# Patient Record
Sex: Female | Born: 1995 | Race: White | Hispanic: No | Smoking: Never smoker
Health system: Southern US, Community
[De-identification: ages and names within clinical notes are randomized; demographics above are authoritative.]

## PROBLEM LIST (undated history)

## (undated) ENCOUNTER — Inpatient Hospital Stay (HOSPITAL_COMMUNITY): Payer: Self-pay

## (undated) ENCOUNTER — Inpatient Hospital Stay: Payer: Self-pay

## (undated) ENCOUNTER — Inpatient Hospital Stay (HOSPITAL_COMMUNITY): Payer: Medicaid Other

## (undated) DIAGNOSIS — Z8742 Personal history of other diseases of the female genital tract: Secondary | ICD-10-CM

## (undated) DIAGNOSIS — N63 Unspecified lump in unspecified breast: Secondary | ICD-10-CM

## (undated) DIAGNOSIS — O1410 Severe pre-eclampsia, unspecified trimester: Secondary | ICD-10-CM

## (undated) DIAGNOSIS — Z8619 Personal history of other infectious and parasitic diseases: Secondary | ICD-10-CM

## (undated) DIAGNOSIS — D649 Anemia, unspecified: Secondary | ICD-10-CM

## (undated) DIAGNOSIS — B379 Candidiasis, unspecified: Secondary | ICD-10-CM

## (undated) HISTORY — DX: Personal history of other diseases of the female genital tract: Z87.42

## (undated) HISTORY — PX: AUGMENTATION MAMMAPLASTY: SUR837

## (undated) HISTORY — DX: Personal history of other infectious and parasitic diseases: Z86.19

## (undated) HISTORY — PX: NO PAST SURGERIES: SHX2092

## (undated) HISTORY — DX: Candidiasis, unspecified: B37.9

---
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
## 2014-01-28 ENCOUNTER — Encounter (HOSPITAL_COMMUNITY): Payer: Self-pay | Admitting: *Deleted

## 2015-03-30 NOTE — L&D Delivery Note (Signed)
Delivery Note At 2:37 AM a viable femalewas delivered via Vaginal, Spontaneous Delivery left hand presented just behind head splinted and delivered followed by remainder of body (Presentation: straight OA.  APGAR: 8, 9; weight 7lbs 8oz .   Placenta status:spontaneous, intact.  Cord: 3VC  withoutcomplications.  Cord pH: N/A  Anesthesia:  Epidural Episiotomy: None Lacerations:  none Suture Repair: N/A Est. Blood Loss (mL):  300  Mom to postpartum.  Baby to Couplet care / Skin to Skin.  Clifton Kovacic M 03/20/2016, 2:49 AM

## 2015-07-14 ENCOUNTER — Emergency Department
Admission: EM | Admit: 2015-07-14 | Discharge: 2015-07-14 | Disposition: A | Discharge status: Home / Self Care | Payer: Medicaid Other | Attending: Emergency Medicine | Admitting: Emergency Medicine

## 2015-07-14 ENCOUNTER — Emergency Department (HOSPITAL_COMMUNITY)
Admission: EM | Admit: 2015-07-14 | Discharge: 2015-07-14 | Disposition: A | Discharge status: Home / Self Care | Payer: Medicaid Other | Attending: Emergency Medicine | Admitting: Emergency Medicine

## 2015-07-14 ENCOUNTER — Encounter: Payer: Self-pay | Admitting: Emergency Medicine

## 2015-07-14 DIAGNOSIS — N6011 Diffuse cystic mastopathy of right breast: Secondary | ICD-10-CM

## 2015-07-14 DIAGNOSIS — R52 Pain, unspecified: Secondary | ICD-10-CM

## 2015-07-14 DIAGNOSIS — N644 Mastodynia: Secondary | ICD-10-CM | POA: Diagnosis present

## 2015-07-14 DIAGNOSIS — N6012 Diffuse cystic mastopathy of left breast: Secondary | ICD-10-CM | POA: Diagnosis not present

## 2015-07-14 DIAGNOSIS — Z8742 Personal history of other diseases of the female genital tract: Secondary | ICD-10-CM | POA: Diagnosis not present

## 2015-07-14 MED ORDER — TRAMADOL HCL 50 MG PO TABS
50.0000 mg | ORAL_TABLET | Freq: Once | ORAL | Status: DC
  Filled 2015-07-14: qty 1

## 2015-07-14 MED ORDER — TRAMADOL HCL 50 MG PO TABS: 50.0000 mg | ORAL_TABLET | Freq: Three times a day (TID) | ORAL | Status: DC | PRN

## 2015-07-14 NOTE — ED Notes (Signed)
Discharge instructions reviewed with patient. Patient verbalized understanding. Patient ambulated to lobby without difficulty.   

## 2015-07-14 NOTE — ED Notes (Signed)
Pt reporting that she first noticed lump in right breast approximately one month ago. Since then area has become painful and larger. Pt has painful, mobile lump in right breast above nipple towards armpit. Pt reports that the pain has gotten so bad over the last week that it has become hard to sleep at night. Pt reports burning pain. Pt denies any nipple discharge. No redness or swelling noted to breast. Pt reports that her last period was much lighter than her periods normally are. She took a pregnancy test at home that was negative. Reports that over the last week she has not been feeling well, with having hot flashes and chills, and then some vomiting yesterday. Denies any urinary symptoms.

## 2015-07-14 NOTE — ED Provider Notes (Signed)
[facility] Emergency Department Provider Note ____________________________________________  Time seen: 2015  I have reviewed the triage vital signs and the nursing notes.  HISTORY  Chief Complaint  Breast Pain  HPI [name] [name] is a 20 y.o. female resents to the ED for evaluation of about a month complaint of breast lump and tenderness on the right side. She denies any local injury, trauma or contusion. She denies any skin changes, warmth, redness, or nipple discharge. She describes a tender, lump, with occasional burning sensations through the breast. She is not breast feeding. She rates her pain at 5/10.   Past Medical History  Diagnosis Date  . No pertinent past medical history   . Yeast infection   . Hx of ovarian cyst   . H/O rubella   . H/O varicella     Patient Active Problem List   Diagnosis Date Noted  . Pre-eclampsia 11/28/2011  . Vaginal delivery 11/27/2011  . Perineal laceration during delivery 11/27/2011  . Post-term pregnancy, 40-42 weeks of gestation 11/26/2011  . Abnormal quad screen 07/27/2011  . Late prenatal care 06/10/2011  . Teen pregnancy 06/10/2011    Past Surgical History  Procedure Laterality Date  . No past surgeries      Current Outpatient Rx  Name  Route  Sig  Dispense  Refill  . cetirizine (ZYRTEC) 10 MG tablet   Oral   Take 10 mg by mouth daily.         Marland Kitchen etonogestrel (NEXPLANON) 68 MG IMPL implant   Subcutaneous   Inject 1 each into the skin once.         . Multiple Vitamin (MULTIVITAMIN WITH MINERALS) TABS   Oral   Take 1 tablet by mouth daily.         . traMADol (ULTRAM) 50 MG tablet   Oral   Take 1 tablet (50 mg total) by mouth 3 (three) times daily as needed.   20 tablet   0    Allergies Review of patient's allergies indicates no known allergies.  Family History  Problem Relation Age of Onset  . Alcohol abuse Mother   . Depression Mother   . Alcohol abuse Father   . Diabetes Maternal  Grandmother   . Mental retardation Cousin   . Autism Cousin     Social History Social History  Substance Use Topics  . Smoking status: Never Smoker   . Smokeless tobacco: Never Used  . Alcohol Use: No   Review of Systems  Constitutional: Negative for fever. Eyes: Negative for visual changes. ENT: Negative for sore throat. Cardiovascular: Negative for chest pain.  Respiratory: Negative for shortness of breath. Gastrointestinal: Negative for abdominal pain, vomiting and diarrhea. Genitourinary: Negative for dysuria. Musculoskeletal: Negative for back pain. Skin: Negative for rash. Right breast pain as above Neurological: Negative for headaches, focal weakness or numbness. ____________________________________________  PHYSICAL EXAM:  VITAL SIGNS: ED Triage Vitals  Enc Vitals Group     BP 07/14/15 1903 190/55 mmHg     Pulse Rate 07/14/15 1903 87     Resp --      Temp 07/14/15 1903 98.4 F (36.9 C)     Temp Source 07/14/15 1903 Oral     SpO2 07/14/15 1903 100 %     Weight 07/14/15 1903 103 lb (46.72 kg)     Height 07/14/15 1903  (1.575 m)     Head Cir --      Peak Flow --  Pain Score 07/14/15 1903 5     Pain Loc --      Pain Edu? --      Excl. in GC? --    Constitutional: Alert and oriented. Well appearing and in no distress. Head: Normocephalic and atraumatic. Hematological/Lymphatic/Immunological: No cervical or supra-sub-/clavicular lymphadenopathy. Cardiovascular: Normal rate, regular rhythm.  Breast: Right breast with normal appearance. No skin dimpling, erythema, induration, or peau 'd orange appearance. No nipple retraction or discharge. Tender, palpable, glandular cyst to the right breast at the upper outer quadrant. Cystic lesion is freely mobile, soft, and compressible. Similar nontender fibrocystic changes noted on the left breast. Respiratory: Normal respiratory effort. No wheezes/rales/rhonchi. Musculoskeletal: Nontender with normal range of  motion in all extremities.  Neurologic:  Normal gait without ataxia. Normal speech and language. No gross focal neurologic deficits are appreciated. Skin:  Skin is warm, dry and intact. No rash noted. Psychiatric: Mood and affect are normal. Patient exhibits appropriate insight and judgment. ____________________________________________  PROCEDURES  Ultram 50 mg PO ____________________________________________  INITIAL IMPRESSION / ASSESSMENT AND PLAN / ED COURSE  Patient with likely acute breast tenderness secondary to fibrocystic changes to the breast. She has multiple palpable glandular lesions to the bilateral breasts, most palpable to the right upper quadrant with localized tenderness. No clinical suspicion for acute breast abscess, mastitis, or malignant lesion. Patient is [facility] for further evaluation and ultrasound. She is discharged with a prescription for Ultram dose as needed for intermittent pain. She is also advised to follow with primary care provider as necessary. ____________________________________________  FINAL CLINICAL IMPRESSION(S) / ED DIAGNOSES  Final diagnoses:  Fibrocystic breast changes of both breasts  Acute breast pain      Lissa HoardJenise V Bacon Chalee Hirota, PA-C 07/14/15 [id number]  Minna AntisKevin Paduchowski, MD 07/14/15 [id number]

## 2015-07-14 NOTE — ED Notes (Signed)
Pt called  No no answer

## 2015-07-14 NOTE — Discharge Instructions (Signed)
Breast Self-Awareness Practicing breast self-awareness may pick up problems early, prevent significant medical complications, and possibly save your life. By practicing breast self-awareness, you can become familiar with how your breasts look and feel and if your breasts are changing. This allows you to notice changes early. It can also offer you some reassurance that your breast health is good. One way to learn what is normal for your breasts and whether your breasts are changing is to do a breast self-exam. If you find a lump or something that was not present in the past, it is best to contact your caregiver right away. Other findings that should be evaluated by your caregiver include nipple discharge, especially if it is bloody; skin changes or reddening; areas where the skin seems to be pulled in (retracted); or new lumps and bumps. Breast pain is seldom associated with cancer (malignancy), but should also be evaluated by a caregiver. HOW TO PERFORM A BREAST SELF-EXAM The best time to examine your breasts is 5-7 days after your menstrual period is over. During menstruation, the breasts are lumpier, and it may be more difficult to pick up changes. If you do not menstruate, have reached menopause, or had your uterus removed (hysterectomy), you should examine your breasts at regular intervals, such as monthly. If you are breastfeeding, examine your breasts after a feeding or after using a breast pump. Breast implants do not decrease the risk for lumps or tumors, so continue to perform breast self-exams as recommended. Talk to your caregiver about how to determine the difference between the implant and breast tissue. Also, talk about the amount of pressure you should use during the exam. Over time, you will become more familiar with the variations of your breasts and more comfortable with the exam. A breast self-exam requires you to remove all your clothes above the waist.  Look at your breasts and nipples.  Stand in front of a mirror in a room with good lighting. With your hands on your hips, push your hands firmly downward. Look for a difference in shape, contour, and size from one breast to the other (asymmetry). Asymmetry includes puckers, dips, or bumps. Also, look for skin changes, such as reddened or scaly areas on the breasts. Look for nipple changes, such as discharge, dimpling, repositioning, or redness.  Carefully feel your breasts. This is best done either in the shower or tub while using soapy water or when flat on your back. Place the arm (on the side of the breast you are examining) above your head. Use the pads (not the fingertips) of your three middle fingers on your opposite hand to feel your breasts. Start in the underarm area and use  inch (2 cm) overlapping circles to feel your breast. Use 3 different levels of pressure (light, medium, and firm pressure) at each circle before moving to the next circle. The light pressure is needed to feel the tissue closest to the skin. The medium pressure will help to feel breast tissue a little deeper, while the firm pressure is needed to feel the tissue close to the ribs. Continue the overlapping circles, moving downward over the breast until you feel your ribs below your breast. Then, move one finger-width towards the center of the body. Continue to use the  inch (2 cm) overlapping circles to feel your breast as you move slowly up toward the collar bone (clavicle) near the base of the neck. Continue the up and down exam using all 3 pressures until you reach the  middle of the chest. Do this with each breast, carefully feeling for lumps or changes.   Keep a written record with breast changes or normal findings for each breast. By writing this information down, you do not need to depend only on memory for size, tenderness, or location. Write down where you are in your menstrual cycle, if you are still menstruating. Breast tissue can have some lumps or thick  tissue. However, see your caregiver if you find anything that concerns you.  SEEK MEDICAL CARE IF:  You see a change in shape, contour, or size of your breasts or nipples.   You see skin changes, such as reddened or scaly areas on the breasts or nipples.   You have an unusual discharge from your nipples.   You feel a new lump or unusually thick areas.    This information is not intended to replace advice given to you by your health care provider. Make sure you discuss any questions you have with your health care provider.   Document Released: 03/15/2005 Document Revised: 03/01/2012 Document Reviewed: 06/30/2011 Elsevier Interactive Patient Education 2016 Elsevier Inc[employer]  Fibrocystic Breast Changes Fibrocystic breast changes occur when breast ducts become blocked, causing painful, fluid-filled lumps (cysts) to form in the breast. This is a common condition that is noncancerous (benign). It occurs when women go through hormonal changes during their menstrual cycle. Fibrocystic breast changes can affect one or both breasts. CAUSES  The exact cause of fibrocystic breast changes is not known, but it may be related to the female hormones estrogen and progesterone. Family traits that get passed from parent to child (genetics) may also be a factor in some cases. SIGNS AND SYMPTOMS   Tenderness, mild discomfort, or pain.   Swelling.   Rope-like feeling when touching the breast.   Lumpy breast, one or both sides.   Changes in breast size, especially before (larger) and after (smaller) the menstrual period.   Green or dark brown nipple discharge (not blood).  Symptoms are usually worse before menstrual periods start and get better toward the end of the menstrual period.  DIAGNOSIS  To make a diagnosis, your health care provider will ask you questions and perform a physical exam of your breasts. The health care provider may recommend other tests that can examine inside your breasts,  such as:  A breast X-ray (mammogram).   Ultrasonography.  An MRI.  If something more than fibrocystic breast changes is suspected, your health care provider may take a breast tissue sample (breast biopsy) to examine. TREATMENT  Often, treatment is not needed. Your health care provider may recommend over-the-counter pain relievers to help lessen pain or discomfort caused by the fibrocystic breast changes. You may also be asked to change your diet to limit or stop eating foods or drinking beverages that contain caffeine. Foods and beverages that contain caffeine include chocolate, soda, coffee, and tea. Reducing sugar and fat in your diet may also help. Your health care provider may also recommend:  Fine needle aspiration to remove fluid from a cyst that is causing pain.   Surgery to remove a large, persistent, and tender cyst. HOME CARE INSTRUCTIONS   Examine your breasts after every menstrual period. If you do not have menstrual periods, check your breasts the first day of every month. Feel for changes, such as more tenderness, a new growth, a change in breast size, or a change in a lump that has always been there.   Only take over-the-counter or prescription medicine  as directed by your health care provider.   Wear a well-fitted support or sports bra, especially when exercising.   Decrease or avoid caffeine, fat, and sugar in your diet as directed by your health care provider.  SEEK MEDICAL CARE IF:   You have fluid leaking (discharge) from your nipples, especially bloody discharge.   You have new lumps or bumps in the breast.   Your breast or breasts become enlarged, red, and painful.   You have areas of your breast that pucker in.   Your nipples appear flat or indented.    This information is not intended to replace advice given to you by your health care provider. Make sure you discuss any questions you have with your health care provider.   Document Released:  12/30/2005 Document Revised: 12/04/2014 Document Reviewed: 09/03/2012 Elsevier Interactive Patient Education 2016 [employer].   Your exam is consistent with a tender breast cyst with underlying fibrocystic breast changes. You have "lumpy" breast. This common, benign conditions is due to the glands within the breast. You should continue to take Tylenol or Motrin for pain. Apply warm compresses for pain relief. Follow-up with the [facility] for further evaluation.

## 2015-07-14 NOTE — ED Notes (Signed)
Unable to located PT in front lobby . Pt called x3.

## 2015-07-14 NOTE — ED Notes (Signed)
Pt presents to ED with c/o painful lump to right breast. Pt states she noticed a small knot about a month ago; area has become larger and more painful since onset. The past week pt has been unable to sleep due to her discomfort. Pt states there is no redness and you can not see the area; only feel it when you press on it. Denies nipple discharge.

## 2015-07-29 ENCOUNTER — Emergency Department
Admission: EM | Admit: 2015-07-29 | Discharge: 2015-07-29 | Disposition: A | Discharge status: Home / Self Care | Payer: Medicaid Other

## 2015-07-29 NOTE — ED Notes (Signed)
Called for pt in waiting room x 3, no answer.  

## 2015-07-29 NOTE — ED Notes (Addendum)
Called in waiting room x 2 no answer

## 2015-07-29 NOTE — ED Notes (Signed)
Called pt in waiting room x 3, no answer.

## 2015-07-30 ENCOUNTER — Ambulatory Visit: Payer: Medicaid Other | Attending: Oncology

## 2015-08-20 ENCOUNTER — Encounter: Payer: Self-pay | Admitting: Emergency Medicine

## 2015-08-20 DIAGNOSIS — Z3A08 8 weeks gestation of pregnancy: Secondary | ICD-10-CM | POA: Diagnosis not present

## 2015-08-20 DIAGNOSIS — O210 Mild hyperemesis gravidarum: Secondary | ICD-10-CM | POA: Diagnosis not present

## 2015-08-20 LAB — COMPREHENSIVE METABOLIC PANEL
ALK PHOS: 33 U/L — AB (ref 38–126)
ALT: 13 U/L — AB (ref 14–54)
AST: 15 U/L (ref 15–41)
Albumin: 4.2 g/dL (ref 3.5–5.0)
Anion gap: 6 (ref 5–15)
BUN: 17 mg/dL (ref 6–20)
CALCIUM: 9.5 mg/dL (ref 8.9–10.3)
CO2: 25 mmol/L (ref 22–32)
CREATININE: 0.62 mg/dL (ref 0.44–1.00)
Chloride: 105 mmol/L (ref 101–111)
Glucose, Bld: 87 mg/dL (ref 65–99)
Potassium: 3.7 mmol/L (ref 3.5–5.1)
Sodium: 136 mmol/L (ref 135–145)
Total Bilirubin: 0.3 mg/dL (ref 0.3–1.2)
Total Protein: 7.6 g/dL (ref 6.5–8.1)

## 2015-08-20 LAB — URINALYSIS COMPLETE WITH MICROSCOPIC (ARMC ONLY)
BACTERIA UA: NONE SEEN
Bilirubin Urine: NEGATIVE
GLUCOSE, UA: NEGATIVE mg/dL
Hgb urine dipstick: NEGATIVE
Ketones, ur: NEGATIVE mg/dL
NITRITE: NEGATIVE
Protein, ur: NEGATIVE mg/dL
SPECIFIC GRAVITY, URINE: 1.03 (ref 1.005–1.030)
pH: 6 (ref 5.0–8.0)

## 2015-08-20 LAB — CBC
HCT: 35.3 % (ref 35.0–47.0)
Hemoglobin: 12.2 g/dL (ref 12.0–16.0)
MCH: 29.6 pg (ref 26.0–34.0)
MCHC: 34.5 g/dL (ref 32.0–36.0)
MCV: 85.8 fL (ref 80.0–100.0)
Platelets: 277 10*3/uL (ref 150–440)
RBC: 4.11 MIL/uL (ref 3.80–5.20)
RDW: 12.1 % (ref 11.5–14.5)
WBC: 13.4 10*3/uL — ABNORMAL HIGH (ref 3.6–11.0)

## 2015-08-20 NOTE — ED Notes (Addendum)
Patient ambulatory to triage with steady gait, without difficulty or distress noted; pt reports N/V and abd cramping; [redacted]wks pregnant; G2; [facility] st was taking zofran that was rx in past and work but ran out

## 2015-08-21 ENCOUNTER — Emergency Department
Admission: EM | Admit: 2015-08-21 | Discharge: 2015-08-21 | Disposition: A | Discharge status: Home / Self Care | Payer: Medicaid Other | Attending: Emergency Medicine | Admitting: Emergency Medicine

## 2015-08-21 DIAGNOSIS — O210 Mild hyperemesis gravidarum: Secondary | ICD-10-CM

## 2015-08-21 LAB — HCG, QUANTITATIVE, PREGNANCY: hCG, Beta Chain, Quant, S: 193266 m[IU]/mL — ABNORMAL HIGH (ref <5)

## 2015-08-21 MED ORDER — METOCLOPRAMIDE HCL 5 MG/ML IJ SOLN
10.0000 mg | Freq: Once | INTRAMUSCULAR | Status: AC
  Administered 2015-08-21: 10 mg via INTRAVENOUS
  Filled 2015-08-21: qty 2

## 2015-08-21 MED ORDER — SODIUM CHLORIDE 0.9 % IV BOLUS (SEPSIS)
1000.0000 mL | Freq: Once | INTRAVENOUS | Status: AC
  Administered 2015-08-21: 1000 mL via INTRAVENOUS

## 2015-08-21 MED ORDER — METOCLOPRAMIDE HCL 10 MG PO TABS: 10.0000 mg | ORAL_TABLET | Freq: Three times a day (TID) | ORAL | Status: DC

## 2015-08-21 NOTE — Discharge Instructions (Signed)
Hyperemesis Gravidarum  Hyperemesis gravidarum is a severe form of nausea and vomiting that happens during pregnancy. Hyperemesis is worse than morning sickness. It may cause you to have nausea or vomiting all day for many days. It may keep you from eating and drinking enough food and liquids. Hyperemesis usually occurs during the first half (the first 20 weeks) of pregnancy. It often goes away once a woman is in her second half of pregnancy. However, sometimes hyperemesis continues through an entire pregnancy.   CAUSES   The cause of this condition is not completely known but is thought to be related to changes in the body's hormones when pregnant. It could be from the high level of the pregnancy hormone or an increase in estrogen in the body.   SIGNS AND SYMPTOMS    Severe nausea and vomiting.   Nausea that does not go away.   Vomiting that does not allow you to keep any food down.   Weight loss and body fluid loss (dehydration).   Having no desire to eat or not liking food you have previously enjoyed.  DIAGNOSIS   Your health care provider will do a physical exam and ask you about your symptoms. He or she may also order blood tests and urine tests to make sure something else is not causing the problem.   TREATMENT   You may only need medicine to control the problem. If medicines do not control the nausea and vomiting, you will be treated in the hospital to prevent dehydration, increased acid in the blood (acidosis), weight loss, and changes in the electrolytes in your body that may harm the unborn baby (fetus). You may need IV fluids.   HOME CARE INSTRUCTIONS    Only take over-the-counter or prescription medicines as directed by your health care provider.   Try eating a couple of dry crackers or toast in the morning before getting out of bed.   Avoid foods and smells that upset your stomach.   Avoid fatty and spicy foods.   Eat 5-6 small meals a day.   Do not drink when eating meals. Drink between  meals.   For snacks, eat high-protein foods, such as cheese.   Eat or suck on things that have ginger in them. Ginger helps nausea.   Avoid food preparation. The smell of food can spoil your appetite.   Avoid iron pills and iron in your multivitamins until after 3-4 months of being pregnant. However, consult with your health care provider before stopping any prescribed iron pills.  SEEK MEDICAL CARE IF:    Your abdominal pain increases.   You have a severe headache.   You have vision problems.   You are losing weight.  SEEK IMMEDIATE MEDICAL CARE IF:    You are unable to keep fluids down.   You vomit blood.   You have constant nausea and vomiting.   You have excessive weakness.   You have extreme thirst.   You have dizziness or fainting.   You have a fever or persistent symptoms for more than 2-3 days.   You have a fever and your symptoms suddenly get worse.  MAKE SURE YOU:    Understand these instructions.   Will watch your condition.   Will get help right away if you are not doing well or get worse.     This information is not intended to replace advice given to you by your health care provider. Make sure you discuss any questions you have with   your health care provider.     Document Released: 03/15/2005 Document Revised: 01/03/2013 Document Reviewed: 10/25/2012  Elsevier Interactive Patient Education 2016 Elsevier Inc.

## 2015-08-21 NOTE — ED Provider Notes (Signed)
[facility] Emergency Department Provider Note  ____________________________________________  Time seen: 1:30 AM  I have reviewed the triage vital signs and the nursing notes.   HISTORY  Chief Complaint Emesis During Pregnancy      HPI [name] [name] is a 20 y.o. female gravida 2 para 1 approximately [redacted] weeks pregnant presents with nausea and vomiting with 5 episodes of nonbloody emesis today. Patient admits to decreased by mouth intake secondary to persistent nausea. Patient denies any abdominal pain or cramping at this time. Patient denies any fever no dysuria.     Past Medical History  Diagnosis Date  . No pertinent past medical history   . Yeast infection   . Hx of ovarian cyst   . H/O rubella   . H/O varicella     Patient Active Problem List   Diagnosis Date Noted  . Pre-eclampsia 11/28/2011  . Vaginal delivery 11/27/2011  . Perineal laceration during delivery 11/27/2011  . Post-term pregnancy, 40-42 weeks of gestation 11/26/2011  . Abnormal quad screen 07/27/2011  . Late prenatal care 06/10/2011  . Teen pregnancy 06/10/2011    Past Surgical History  Procedure Laterality Date  . No past surgeries      Current Outpatient Rx  Name  Route  Sig  Dispense  Refill  . cetirizine (ZYRTEC) 10 MG tablet   Oral   Take 10 mg by mouth daily.         Marland Kitchen etonogestrel (NEXPLANON) 68 MG IMPL implant   Subcutaneous   Inject 1 each into the skin once.         . metoCLOPramide (REGLAN) 10 MG tablet   Oral   Take 1 tablet (10 mg total) by mouth 3 (three) times daily with meals.   60 tablet   0   . Multiple Vitamin (MULTIVITAMIN WITH MINERALS) TABS   Oral   Take 1 tablet by mouth daily.         . traMADol (ULTRAM) 50 MG tablet   Oral   Take 1 tablet (50 mg total) by mouth 3 (three) times daily as needed.   20 tablet   0     Allergies Penicillins  Family History  Problem Relation Age of Onset  . Alcohol abuse Mother   .  Depression Mother   . Alcohol abuse Father   . Diabetes Maternal Grandmother   . Mental retardation Cousin   . Autism Cousin     Social History Social History  Substance Use Topics  . Smoking status: Never Smoker   . Smokeless tobacco: Never Used  . Alcohol Use: No    Review of Systems  Constitutional: Negative for fever. Eyes: Negative for visual changes. ENT: Negative for sore throat. Cardiovascular: Negative for chest pain. Respiratory: Negative for shortness of breath. Gastrointestinal: Positive for vomiting Genitourinary: Negative for dysuria. Musculoskeletal: Negative for back pain. Skin: Negative for rash. Neurological: Negative for headaches, focal weakness or numbness.  10-point ROS otherwise negative.  ____________________________________________   PHYSICAL EXAM:  VITAL SIGNS: ED Triage Vitals  Enc Vitals Group     BP 08/20/15 2303 111/68 mmHg     Pulse Rate 08/20/15 2303 100     Resp 08/20/15 2303 18     Temp 08/20/15 2303 98.2 F (36.8 C)     Temp Source 08/20/15 2303 Oral     SpO2 08/20/15 2303 95 %     Weight 08/20/15 2303 103 lb (46.72 kg)     Height 08/20/15 2303  (  1.575 m)     Head Cir --      Peak Flow --      Pain Score 08/20/15 2303 7     Pain Loc --      Pain Edu? --      Excl. in GC? --     Constitutional: Alert and oriented. Well appearing and in no distress. Eyes: Conjunctivae are normal. PERRL. Normal extraocular movements. ENT   Head: Normocephalic and atraumatic.   Nose: No congestion/rhinnorhea.   Mouth/Throat: Mucous membranes are moist.   Neck: No stridor. Hematological/Lymphatic/Immunilogical: No cervical lymphadenopathy. Cardiovascular: Normal rate, regular rhythm. Normal and symmetric distal pulses are present in all extremities. No murmurs, rubs, or gallops. Respiratory: Normal respiratory effort without tachypnea nor retractions. Breath sounds are clear and equal bilaterally. No  wheezes/rales/rhonchi. Gastrointestinal: Soft and nontender. No distention. There is no CVA tenderness. Genitourinary: deferred Musculoskeletal: Nontender with normal range of motion in all extremities. No joint effusions.  No lower extremity tenderness nor edema. Neurologic:  Normal speech and language. No gross focal neurologic deficits are appreciated. Speech is normal.  Skin:  Skin is warm, dry and intact. No rash noted. Psychiatric: Mood and affect are normal. Speech and behavior are normal. Patient exhibits appropriate insight and judgment.  ____________________________________________    LABS (pertinent positives/negatives)  Labs Reviewed  CBC - Abnormal; Notable for the following:    WBC 13.4 (*)    All other components within normal limits  COMPREHENSIVE METABOLIC PANEL - Abnormal; Notable for the following:    ALT 13 (*)    Alkaline Phosphatase 33 (*)    All other components within normal limits  URINALYSIS COMPLETEWITH MICROSCOPIC ([facility] ONLY) - Abnormal; Notable for the following:    Color, Urine YELLOW (*)    APPearance CLEAR (*)    Leukocytes, UA TRACE (*)    Squamous Epithelial / LPF 0-5 (*)    All other components within normal limits  HCG, QUANTITATIVE, PREGNANCY - Abnormal; Notable for the following:    hCG, Beta Chain, Quant, S [id number] (*)    All other components within normal limits        INITIAL IMPRESSION / ASSESSMENT AND PLAN / ED COURSE  Pertinent labs & imaging results that were available during my care of the patient were reviewed by me and considered in my medical decision making (see chart for details).  Patient received 10 mg IV Reglan and 1 L normal saline with resolution of nausea. Patient will be prescribed Reglan for home. Given absence of any pelvic pain or vaginal bleeding ultrasounds performed today. He says she has appointment with her OB/GYN on Friday  ____________________________________________   FINAL CLINICAL IMPRESSION(S) / ED  DIAGNOSES  Final diagnoses:  Hyperemesis gravidarum      Darci Currentandolph N Toree Edling, MD 08/21/15 [phone]

## 2015-08-21 NOTE — ED Notes (Signed)
Patient tolerated PO challenge

## 2015-08-21 NOTE — ED Notes (Signed)
Patient is pregnant. This is her second pregnancy. Patient states she has been sick for whole pregnancy but vomiting has been worse over last two weeks. States she can eat but throws up about 1/2 hour after eating or drinking. States she has not contacted her MD about this. Husband states he is worried about her being dehydrated.

## 2016-03-19 ENCOUNTER — Inpatient Hospital Stay: Payer: Medicaid Other | Admitting: Anesthesiology

## 2016-03-19 ENCOUNTER — Inpatient Hospital Stay
Admission: EM | Admit: 2016-03-19 | Discharge: 2016-03-21 | DRG: 775 | Disposition: A | Discharge status: Home / Self Care | Payer: Medicaid Other | Attending: Obstetrics and Gynecology | Admitting: Obstetrics and Gynecology

## 2016-03-19 DIAGNOSIS — Z3A38 38 weeks gestation of pregnancy: Secondary | ICD-10-CM

## 2016-03-19 DIAGNOSIS — Z880 Allergy status to penicillin: Secondary | ICD-10-CM | POA: Diagnosis not present

## 2016-03-19 DIAGNOSIS — O479 False labor, unspecified: Secondary | ICD-10-CM | POA: Diagnosis present

## 2016-03-19 DIAGNOSIS — D62 Acute posthemorrhagic anemia: Secondary | ICD-10-CM | POA: Diagnosis not present

## 2016-03-19 DIAGNOSIS — Z3493 Encounter for supervision of normal pregnancy, unspecified, third trimester: Secondary | ICD-10-CM | POA: Diagnosis present

## 2016-03-19 DIAGNOSIS — O9902 Anemia complicating childbirth: Secondary | ICD-10-CM | POA: Diagnosis present

## 2016-03-19 DIAGNOSIS — Z833 Family history of diabetes mellitus: Secondary | ICD-10-CM

## 2016-03-19 HISTORY — DX: Anemia, unspecified: D64.9

## 2016-03-19 LAB — CBC
HEMATOCRIT: 30.9 % — AB (ref 35.0–47.0)
HEMOGLOBIN: 10.3 g/dL — AB (ref 12.0–16.0)
MCH: 28.6 pg (ref 26.0–34.0)
MCHC: 33.5 g/dL (ref 32.0–36.0)
MCV: 85.4 fL (ref 80.0–100.0)
Platelets: 363 10*3/uL (ref 150–440)
RBC: 3.62 MIL/uL — AB (ref 3.80–5.20)
RDW: 13.1 % (ref 11.5–14.5)
WBC: 18.9 10*3/uL — ABNORMAL HIGH (ref 3.6–11.0)

## 2016-03-19 LAB — TYPE AND SCREEN
ABO/RH(D): A POS
Antibody Screen: NEGATIVE

## 2016-03-19 MED ORDER — BUPIVACAINE HCL (PF) 0.25 % IJ SOLN
INTRAMUSCULAR | Status: DC | PRN
  Administered 2016-03-19: 5 mL via EPIDURAL

## 2016-03-19 MED ORDER — OXYTOCIN 40 UNITS IN LACTATED RINGERS INFUSION - SIMPLE MED: 2.5000 [IU]/h | INTRAVENOUS | Status: DC

## 2016-03-19 MED ORDER — LACTATED RINGERS IV SOLN: 500.0000 mL | INTRAVENOUS | Status: DC | PRN

## 2016-03-19 MED ORDER — SOD CITRATE-CITRIC ACID 500-334 MG/5ML PO SOLN
30.0000 mL | ORAL | Status: DC | PRN
  Administered 2016-03-20: 30 mL via ORAL
  Filled 2016-03-19: qty 15

## 2016-03-19 MED ORDER — LIDOCAINE-EPINEPHRINE (PF) 1.5 %-1:200000 IJ SOLN
INTRAMUSCULAR | Status: DC | PRN
  Administered 2016-03-19: 4 mL via PERINEURAL

## 2016-03-19 MED ORDER — BUTORPHANOL TARTRATE 1 MG/ML IJ SOLN: 1.0000 mg | INTRAMUSCULAR | Status: DC | PRN

## 2016-03-19 MED ORDER — FENTANYL 2.5 MCG/ML W/ROPIVACAINE 0.2% IN NS 100 ML EPIDURAL INFUSION (ARMC-ANES)
EPIDURAL | Status: DC | PRN
  Administered 2016-03-19: 10 mL/h via EPIDURAL

## 2016-03-19 MED ORDER — ONDANSETRON HCL 4 MG/2ML IJ SOLN: 4.0000 mg | Freq: Four times a day (QID) | INTRAMUSCULAR | Status: DC | PRN

## 2016-03-19 MED ORDER — OXYTOCIN BOLUS FROM INFUSION
500.0000 mL | Freq: Once | INTRAVENOUS | Status: AC
  Administered 2016-03-20: 500 mL via INTRAVENOUS

## 2016-03-19 MED ORDER — LIDOCAINE HCL (PF) 1 % IJ SOLN
30.0000 mL | INTRAMUSCULAR | Status: AC | PRN
  Administered 2016-03-19: 3 mL via SUBCUTANEOUS

## 2016-03-19 MED ORDER — OXYCODONE-ACETAMINOPHEN 5-325 MG PO TABS: 1.0000 | ORAL_TABLET | ORAL | Status: DC | PRN

## 2016-03-19 MED ORDER — FENTANYL 2.5 MCG/ML W/ROPIVACAINE 0.2% IN NS 100 ML EPIDURAL INFUSION (ARMC-ANES)
EPIDURAL | Status: AC
  Filled 2016-03-19: qty 100

## 2016-03-19 MED ORDER — ACETAMINOPHEN 325 MG PO TABS: 650.0000 mg | ORAL_TABLET | ORAL | Status: DC | PRN

## 2016-03-19 MED ORDER — LACTATED RINGERS IV SOLN
INTRAVENOUS | Status: DC
  Administered 2016-03-19: 23:00:00 via INTRAVENOUS

## 2016-03-19 MED ORDER — OXYCODONE-ACETAMINOPHEN 5-325 MG PO TABS: 2.0000 | ORAL_TABLET | ORAL | Status: DC | PRN

## 2016-03-19 NOTE — H&P (Signed)
Obstetric H&P   Chief Complaint: Contractions    Prenatal Care Provider: [facility]  History of Present Illness: 20 y.o. G2P1001 6832w3d by 03/30/2016, by Last Menstrual Period = 8 week US presenting to L&D with contractions starting this evening.  No LOF, no VB, +FM.  PNC uncomplicated other than anemia on 28 week labs for which she was started on supplemental iron.  She has a history of preeclampsia with severe features in her G1 pregnancy.  29lbs weight gain this pregnancy, pelvis tested to 8lbs 12oz.   PNL A pos / ABSC neg / RI / VZI / HBsAg neg / RPR NR / HIV neg / First trimester screen negative / 1-hr 122 / GBS negative  TDAP 02/09/2016, influenza vaccination 01/09/2016  Review of Systems: 10 point review of systems negative unless otherwise noted in HPI  Past Medical History: Past Medical History:  Diagnosis Date  . Anemia   . H/O rubella   . H/O varicella   . Hx of ovarian cyst   . No pertinent past medical history   . Yeast infection     Past Surgical History: Past Surgical History:  Procedure Laterality Date  . NO PAST SURGERIES      Family History: Family History  Problem Relation Age of Onset  . Alcohol abuse Mother   . Depression Mother   . Alcohol abuse Father   . Diabetes Maternal Grandmother   . Mental retardation Cousin   . Autism Cousin     Social History: Social History   Social History  . Marital status: Married    Spouse name: N/A  . Number of children: N/A  . Years of education: N/A   Occupational History  . Not on file.   Social History Main Topics  . Smoking status: Never Smoker  . Smokeless tobacco: Never Used  . Alcohol use No  . Drug use: No  . Sexual activity: Yes    Birth control/ protection: None, Injection     Comment: currently pregnant   Other Topics Concern  . Not on file   Social History Narrative  . No narrative on file    Medications: Prior to Admission medications   Medication Sig Start Date End Date Taking?  Authorizing Provider  Multiple Vitamin (MULTIVITAMIN WITH MINERALS) TABS Take 1 tablet by mouth daily.   Yes Historical Provider, MD  cetirizine (ZYRTEC) 10 MG tablet Take 10 mg by mouth daily.    Historical Provider, MD  etonogestrel (NEXPLANON) 68 MG IMPL implant Inject 1 each into the skin once.    Historical Provider, MD  metoCLOPramide (REGLAN) 10 MG tablet Take 1 tablet (10 mg total) by mouth 3 (three) times daily with meals. Patient not taking: Reported on 03/19/2016 08/21/15 08/20/16  Darci Currentandolph N Brown, MD  traMADol (ULTRAM) 50 MG tablet Take 1 tablet (50 mg total) by mouth 3 (three) times daily as needed. Patient not taking: Reported on 03/19/2016 07/14/15   Charlesetta IvoryJenise V Bacon Menshew, PA-C    Allergies: Allergies  Allergen Reactions  . Penicillins Hives    Physical Exam: Vitals: Blood pressure 118/75, pulse (!) 105, temperature 98.7 F (37.1 C), temperature source Oral, resp. rate 19, height 5\' 2"  (1.575 m), weight 136 lb (61.7 kg), last menstrual period 06/24/2015.  Urine Dip Protein: N/A  FHT: 150, moderate variability, +accels, no decels Toco: q2-333min  General: Contracting uncomfortably HEENT: normocephalic, anicteric Pulmonary: no increased work of breathing Cardiovascular: RRR Abdomen: Gravid,  Non-tender Leopolds: vtx 7 1/2 lbs Genitourinary: 3.5cm  on admission made change to 5cm during rule out Extremities:  Labs: No results found for this or any previous visit (from the past 24 hour(s)).  Assessment: 20 y.o. G2P1001 2647w3d by 03/30/2016, by Last Menstrual Period presenting in term labor  Plan: 1) Labor - expectant managemnt  2) Fetus - category I tracing  3) PNL - A pos / ABSC neg / RI / VZI / HBsAg neg / RPR NR / HIV neg / First trimester screen negative / 1-hr 122 / GBS negative  4) TDAP - TDAP 02/09/2016, influenza vaccination 01/09/2016  5) Disposition - pending delivery

## 2016-03-19 NOTE — Anesthesia Procedure Notes (Signed)
Epidural Patient location during procedure: OB  Staffing Performed: anesthesiologist   Preanesthetic Checklist Completed: patient identified, site marked, surgical consent, pre-op evaluation, timeout performed, IV checked, risks and benefits discussed and monitors and equipment checked  Epidural Patient position: sitting Prep: Betadine Patient monitoring: heart rate, continuous pulse ox and blood pressure Approach: midline Location: L3-L4 Injection technique: LOR saline  Needle:  Needle type: Tuohy  Needle gauge: 17 G Needle length: 9 cm and 9 Needle insertion depth: 5 cm Catheter type: closed end flexible Catheter size: 19 Gauge Catheter at skin depth: 11 cm Test dose: negative and 1.5% lidocaine with Epi 1:200 K  Assessment Sensory level: T10 Events: blood not aspirated, injection not painful, no injection resistance, negative IV test and no paresthesia  Additional Notes   Patient tolerated the insertion well without complications.-SATD -IVTD. No paresthesia. Refer to [facility] nursing for VS and dosingReason for block:procedure for pain     

## 2016-03-19 NOTE — Anesthesia Preprocedure Evaluation (Signed)
Anesthesia Evaluation  Patient identified by MRN, date of birth, ID band Patient awake    Reviewed: Allergy & Precautions, H&P , NPO status , Patient's Chart, lab work & pertinent test results, reviewed documented beta blocker date and time   Airway Mallampati: II  TM Distance: >3 FB Neck ROM: full    Dental no notable dental hx. (+) Teeth Intact   Pulmonary neg pulmonary ROS, Current Smoker,    Pulmonary exam normal breath sounds clear to auscultation       Cardiovascular Exercise Tolerance: Good hypertension, negative cardio ROS   Rhythm:regular Rate:Normal     Neuro/Psych negative neurological ROS  negative psych ROS   GI/Hepatic negative GI ROS, Neg liver ROS,   Endo/Other  negative endocrine ROSdiabetes  Renal/GU      Musculoskeletal   Abdominal   Peds  Hematology negative hematology ROS (+) anemia ,   Anesthesia Other Findings   Reproductive/Obstetrics (+) Pregnancy                             Anesthesia Physical Anesthesia Plan  ASA: II  Anesthesia Plan: Epidural   Post-op Pain Management:    Induction:   Airway Management Planned:   Additional Equipment:   Intra-op Plan:   Post-operative Plan:   Informed Consent: I have reviewed the patients History and Physical, chart, labs and discussed the procedure including the risks, benefits and alternatives for the proposed anesthesia with the patient or authorized representative who has indicated his/her understanding and acceptance.     Plan Discussed with:   Anesthesia Plan Comments:         Anesthesia Quick Evaluation

## 2016-03-20 LAB — CBC
HEMATOCRIT: 23.9 % — AB (ref 35.0–47.0)
Hemoglobin: 8 g/dL — ABNORMAL LOW (ref 12.0–16.0)
MCH: 28.7 pg (ref 26.0–34.0)
MCHC: 33.5 g/dL (ref 32.0–36.0)
MCV: 85.7 fL (ref 80.0–100.0)
Platelets: 273 10*3/uL (ref 150–440)
RBC: 2.79 MIL/uL — ABNORMAL LOW (ref 3.80–5.20)
RDW: 12.7 % (ref 11.5–14.5)
WBC: 20.7 10*3/uL — ABNORMAL HIGH (ref 3.6–11.0)

## 2016-03-20 LAB — CHLAMYDIA/NGC RT PCR (ARMC ONLY)
CHLAMYDIA TR: NOT DETECTED
N GONORRHOEAE: NOT DETECTED

## 2016-03-20 MED ORDER — WITCH HAZEL-GLYCERIN EX PADS: 1.0000 "application " | MEDICATED_PAD | CUTANEOUS | Status: DC | PRN

## 2016-03-20 MED ORDER — DIPHENHYDRAMINE HCL 25 MG PO CAPS: 25.0000 mg | ORAL_CAPSULE | Freq: Four times a day (QID) | ORAL | Status: DC | PRN

## 2016-03-20 MED ORDER — MISOPROSTOL 200 MCG PO TABS
ORAL_TABLET | ORAL | Status: AC
  Filled 2016-03-20: qty 4

## 2016-03-20 MED ORDER — OXYCODONE-ACETAMINOPHEN 5-325 MG PO TABS: 2.0000 | ORAL_TABLET | ORAL | Status: DC | PRN

## 2016-03-20 MED ORDER — ONDANSETRON HCL 4 MG/2ML IJ SOLN: 4.0000 mg | INTRAMUSCULAR | Status: DC | PRN

## 2016-03-20 MED ORDER — BENZOCAINE-MENTHOL 20-0.5 % EX AERO: 1.0000 "application " | INHALATION_SPRAY | CUTANEOUS | Status: DC | PRN

## 2016-03-20 MED ORDER — COCONUT OIL OIL
1.0000 "application " | TOPICAL_OIL | Status: DC | PRN
  Administered 2016-03-21: 1 via TOPICAL
  Filled 2016-03-20: qty 120

## 2016-03-20 MED ORDER — FERROUS FUMARATE 324 (106 FE) MG PO TABS
1.0000 | ORAL_TABLET | Freq: Two times a day (BID) | ORAL | Status: DC
  Administered 2016-03-20 – 2016-03-21 (×3): 106 mg via ORAL
  Filled 2016-03-20 (×3): qty 1

## 2016-03-20 MED ORDER — ACETAMINOPHEN 325 MG PO TABS: 650.0000 mg | ORAL_TABLET | ORAL | Status: DC | PRN

## 2016-03-20 MED ORDER — AMMONIA AROMATIC IN INHA
RESPIRATORY_TRACT | Status: AC
  Filled 2016-03-20: qty 10

## 2016-03-20 MED ORDER — IBUPROFEN 600 MG PO TABS
600.0000 mg | ORAL_TABLET | Freq: Four times a day (QID) | ORAL | Status: DC
  Administered 2016-03-21 (×2): 600 mg via ORAL
  Filled 2016-03-20 (×3): qty 1

## 2016-03-20 MED ORDER — SENNOSIDES-DOCUSATE SODIUM 8.6-50 MG PO TABS
2.0000 | ORAL_TABLET | ORAL | Status: DC
  Administered 2016-03-21: 2 via ORAL
  Filled 2016-03-20: qty 2

## 2016-03-20 MED ORDER — OXYTOCIN 10 UNIT/ML IJ SOLN
INTRAMUSCULAR | Status: AC
  Filled 2016-03-20: qty 2

## 2016-03-20 MED ORDER — SIMETHICONE 80 MG PO CHEW: 80.0000 mg | CHEWABLE_TABLET | ORAL | Status: DC | PRN

## 2016-03-20 MED ORDER — PRENATAL MULTIVITAMIN CH
1.0000 | ORAL_TABLET | Freq: Every day | ORAL | Status: DC
  Administered 2016-03-20 – 2016-03-21 (×2): 1 via ORAL
  Filled 2016-03-20 (×2): qty 1

## 2016-03-20 MED ORDER — IBUPROFEN 600 MG PO TABS
600.0000 mg | ORAL_TABLET | Freq: Four times a day (QID) | ORAL | Status: DC
  Administered 2016-03-20 (×3): 600 mg via ORAL
  Filled 2016-03-20 (×3): qty 1

## 2016-03-20 MED ORDER — LIDOCAINE HCL (PF) 1 % IJ SOLN
INTRAMUSCULAR | Status: AC
  Filled 2016-03-20: qty 30

## 2016-03-20 MED ORDER — ONDANSETRON HCL 4 MG PO TABS: 4.0000 mg | ORAL_TABLET | ORAL | Status: DC | PRN

## 2016-03-20 MED ORDER — OXYCODONE-ACETAMINOPHEN 5-325 MG PO TABS: 1.0000 | ORAL_TABLET | ORAL | Status: DC | PRN

## 2016-03-20 MED ORDER — DIBUCAINE 1 % RE OINT: 1.0000 "application " | TOPICAL_OINTMENT | RECTAL | Status: DC | PRN

## 2016-03-20 NOTE — Progress Notes (Signed)
  Postpartum Day 0  Subjective: no complaints and up ad lib  Objective: Blood pressure (!) 105/49, pulse 84, temperature 98 F (36.7 C), temperature source Oral, resp. rate 18, height 5\' 2"  (1.575 m), weight 136 lb (61.7 kg), last menstrual period 06/24/2015, SpO2 100 %.  Physical Exam:  General: alert and cooperative Lochia: appropriate Uterine Fundus: firm Incision: N/A DVT Evaluation: No evidence of DVT seen on physical exam. Abdomen: soft, NT   Recent Labs  03/19/16 2229 03/20/16 0532  HGB 10.3* 8.0*  HCT 30.9* 23.9*    Assessment PPD #0,  Acute blood loss anemia  Plan: Discharge tomorrow, Continue PP care, Advance activity as tolerated and Fe replacement, anemia precautions  Feeding: breast Contraception: undecided Blood Type: A+ RI/V(I TDAP UTD    [name] [name], [name] [name], [place] 03/20/2016, 11:34 AM

## 2016-03-20 NOTE — Discharge Summary (Addendum)
Obstetric Discharge Summary Reason for Admission: onset of labor Prenatal Procedures: none Intrapartum Procedures: spontaneous vaginal delivery Postpartum Procedures: none Complications-Operative and Postpartum: none Hemoglobin  Date Value Ref Range Status  03/20/2016 8.0 (L) 12.0 - 16.0 g/dL Final   HGB  Date Value Ref Range Status  08/09/2013 13.0 12.0 - 16.0 g/dL Final   HCT  Date Value Ref Range Status  03/20/2016 23.9 (L) 35.0 - 47.0 % Final  08/09/2013 38.5 35.0 - 47.0 % Final    Physical Exam:  General: alert, appears stated age and no distress Lochia: appropriate Uterine Fundus: firm DVT Evaluation: No evidence of DVT seen on physical exam.  Discharge Diagnoses: Term Pregnancy-delivered  Discharge Information: Date: 03/21/2016 Activity: pelvic rest Diet: routine Allergies as of 03/21/2016      Reactions   Penicillins Hives      Medication List    STOP taking these medications   cetirizine 10 MG tablet Commonly known as:  ZYRTEC   metoCLOPramide 10 MG tablet Commonly known as:  REGLAN   NEXPLANON 68 MG Impl implant Generic drug:  etonogestrel   traMADol 50 MG tablet Commonly known as:  ULTRAM     TAKE these medications   ferrous sulfate 325 (65 FE) MG tablet Commonly known as:  FERROUSUL Take 1 tablet (325 mg total) by mouth daily with breakfast.   ibuprofen 600 MG tablet Commonly known as:  ADVIL,MOTRIN Take 1 tablet (600 mg total) by mouth every 6 (six) hours as needed for cramping.   multivitamin with minerals Tabs tablet Take 1 tablet by mouth daily.       Condition: stable Discharge to: home Follow-up Information    [name] [name], [name] Gerbino M, MD Follow up in 6 week(s).   Specialty:  Obstetrics and Gynecology Why:  postpartum visit Contact information: [address] [place] [address] [phone]           Newborn Data: Live born unspecified sex  Birth Weight:   APGAR: ,   Home with mother.  [name] Collier  [name] 03/21/2016, 9:23 AM

## 2016-03-20 NOTE — Progress Notes (Signed)
Subjective:  Comfortable with epidural in place  Objective:   Vitals: Blood pressure (!) 91/53, pulse 91, temperature 98.3 F (36.8 C), temperature source Oral, resp. rate 19, height 5\' 2"  (1.575 m), weight 136 lb (61.7 kg), last menstrual period 06/24/2015. General:  Abdomen: Cervical Exam:  Dilation: 7.5 Effacement (%): 100 Station: +1 Exam by:: AS, MD AROM clear  FHT: 140, moderate, +accels, no decels Toco: not picking up well but making change  Results for orders placed or performed during the hospital encounter of 03/19/16 (from the past 24 hour(s))  CBC     Status: Abnormal   Collection Time: 03/19/16 10:29 PM  Result Value Ref Range   WBC 18.9 (H) 3.6 - 11.0 K/uL   RBC 3.62 (L) 3.80 - 5.20 MIL/uL   Hemoglobin 10.3 (L) 12.0 - 16.0 g/dL   HCT 40.930.9 (L) 81.135.0 - 91.447.0 %   MCV 85.4 80.0 - 100.0 fL   MCH 28.6 26.0 - 34.0 pg   MCHC 33.5 32.0 - 36.0 g/dL   RDW 78.213.1 95.611.5 - 21.314.5 %   Platelets 363 150 - 440 K/uL  Type and screen [facility]     Status: None   Collection Time: 03/19/16 10:29 PM  Result Value Ref Range   ABO/RH(D) A POS    Antibody Screen NEG    Sample Expiration 03/22/2016   Chlamydia/NGC rt PCR ([facility] only)     Status: None   Collection Time: 03/19/16 10:29 PM  Result Value Ref Range   Specimen source GC/Chlam URINE, RANDOM    Chlamydia Tr NOT DETECTED NOT DETECTED   N gonorrhoeae NOT DETECTED NOT DETECTED    Assessment:   20 y.o. G2P1001 6936w4d term labor  Plan:   1) Labor - AROM clear  2) Fetus - category I tracing

## 2016-03-21 LAB — RPR: RPR: NONREACTIVE

## 2016-03-21 MED ORDER — FERROUS SULFATE 325 (65 FE) MG PO TABS: 325.0000 mg | ORAL_TABLET | Freq: Every day | ORAL | 1 refills | Status: DC

## 2016-03-21 MED ORDER — IBUPROFEN 600 MG PO TABS: 600.0000 mg | ORAL_TABLET | Freq: Four times a day (QID) | ORAL | 0 refills | Status: DC | PRN

## 2016-03-21 NOTE — Discharge Instructions (Signed)
Vaginal Delivery, Care After °Refer to this sheet in the next few weeks. These instructions provide you with information about caring for yourself after vaginal delivery. Your health care provider may also give you more specific instructions. Your treatment has been planned according to current medical practices, but problems sometimes occur. Call your health care provider if you have any problems or questions. °What can I expect after the procedure? °After vaginal delivery, it is common to have: °· Some bleeding from your vagina. °· Soreness in your abdomen, your vagina, and the area of skin between your vaginal opening and your anus (perineum). °· Pelvic cramps. °· Fatigue. ° °Follow these instructions at home: °Medicines °· Take over-the-counter and prescription medicines only as told by your health care provider. °· If you were prescribed an antibiotic medicine, take it as told by your health care provider. Do not stop taking the antibiotic until it is finished. °Driving ° °· Do not drive or operate heavy machinery while taking prescription pain medicine. °· Do not drive for 24 hours if you received a sedative. °Lifestyle °· Do not drink alcohol. This is especially important if you are breastfeeding or taking medicine to relieve pain. °· Do not use tobacco products, including cigarettes, chewing tobacco, or e-cigarettes. If you need help quitting, ask your health care provider. °Eating and drinking °· Drink at least 8 eight-ounce glasses of water every day unless you are told not to by your health care provider. If you choose to breastfeed your baby, you may need to drink more water than this. °· Eat high-fiber foods every day. These foods may help prevent or relieve constipation. High-fiber foods include: °? Whole grain cereals and breads. °? Brown rice. °? Beans. °? Fresh fruits and vegetables. °Activity °· Return to your normal activities as told by your health care provider. Ask your health care provider  what activities are safe for you. °· Rest as much as possible. Try to rest or take a nap when your baby is sleeping. °· Do not lift anything that is heavier than your baby or 10 lb (4.5 kg) until your health care provider says that it is safe. °· Talk with your health care provider about when you can engage in sexual activity. This may depend on your: °? Risk of infection. °? Rate of healing. °? Comfort and desire to engage in sexual activity. °Vaginal Care °· If you have an episiotomy or a vaginal tear, check the area every day for signs of infection. Check for: °? More redness, swelling, or pain. °? More fluid or blood. °? Warmth. °? Pus or a bad smell. °· Do not use tampons or douches until your health care provider says this is safe. °· Watch for any blood clots that may pass from your vagina. These may look like clumps of dark red, brown, or black discharge. °General instructions °· Keep your perineum clean and dry as told by your health care provider. °· Wear loose, comfortable clothing. °· Wipe from front to back when you use the toilet. °· Ask your health care provider if you can shower or take a bath. If you had an episiotomy or a perineal tear during labor and delivery, your health care provider may tell you not to take baths for a certain length of time. °· Wear a bra that supports your breasts and fits you well. °· If possible, have someone help you with household activities and help care for your baby for at least a few days after   you leave the hospital. °· Keep all follow-up visits for you and your baby as told by your health care provider. This is important. °Contact a health care provider if: °· You have: °? Vaginal discharge that has a bad smell. °? Difficulty urinating. °? Pain when urinating. °? A sudden increase or decrease in the frequency of your bowel movements. °? More redness, swelling, or pain around your episiotomy or vaginal tear. °? More fluid or blood coming from your episiotomy or  vaginal tear. °? Pus or a bad smell coming from your episiotomy or vaginal tear. °? A fever. °? A rash. °? Little or no interest in activities you used to enjoy. °? Questions about caring for yourself or your baby. °· Your episiotomy or vaginal tear feels warm to the touch. °· Your episiotomy or vaginal tear is separating or does not appear to be healing. °· Your breasts are painful, hard, or turn red. °· You feel unusually sad or worried. °· You feel nauseous or you vomit. °· You pass large blood clots from your vagina. If you pass a blood clot from your vagina, save it to show to your health care provider. Do not flush blood clots down the toilet without having your health care provider look at them. °· You urinate more than usual. °· You are dizzy or light-headed. °· You have not breastfed at all and you have not had a menstrual period for 12 weeks after delivery. °· You have stopped breastfeeding and you have not had a menstrual period for 12 weeks after you stopped breastfeeding. °Get help right away if: °· You have: °? Pain that does not go away or does not get better with medicine. °? Chest pain. °? Difficulty breathing. °? Blurred vision or spots in your vision. °? Thoughts about hurting yourself or your baby. °· You develop pain in your abdomen or in one of your legs. °· You develop a severe headache. °· You faint. °· You bleed from your vagina so much that you fill two sanitary pads in one hour. °This information is not intended to replace advice given to you by your health care provider. Make sure you discuss any questions you have with your health care provider. °Document Released: 03/12/2000 Document Revised: 08/27/2015 Document Reviewed: 03/30/2015 °[employer] Interactive Patient Education © 2017 [employer] ° ° °Breastfeeding °Deciding to breastfeed is one of the best choices you can make for you and your baby. A change in hormones during pregnancy causes your breast tissue to grow and increases the  number and size of your milk ducts. These hormones also allow proteins, sugars, and fats from your blood supply to make breast milk in your milk-producing glands. Hormones prevent breast milk from being released before your baby is born as well as prompt milk flow after birth. Once breastfeeding has begun, thoughts of your baby, as well as his or her sucking or crying, can stimulate the release of milk from your milk-producing glands. °Benefits of breastfeeding °For Your Baby °· Your first milk (colostrum) helps your baby's digestive system function better. °· There are antibodies in your milk that help your baby fight off infections. °· Your baby has a lower incidence of asthma, allergies, and sudden infant death syndrome. °· The nutrients in breast milk are better for your baby than infant formulas and are designed uniquely for your baby’s needs. °· Breast milk improves your baby's brain development. °· Your baby is less likely to develop other conditions, such as childhood obesity,   asthma, or type 2 diabetes mellitus. ° °For You °· Breastfeeding helps to create a very special bond between you and your baby. °· Breastfeeding is convenient. Breast milk is always available at the correct temperature and costs nothing. °· Breastfeeding helps to burn calories and helps you lose the weight gained during pregnancy. °· Breastfeeding makes your uterus contract to its prepregnancy size faster and slows bleeding (lochia) after you give birth. °· Breastfeeding helps to lower your risk of developing type 2 diabetes mellitus, osteoporosis, and breast or ovarian cancer later in life. ° °Signs that your baby is hungry °Early Signs of Hunger °· Increased alertness or activity. °· Stretching. °· Movement of the head from side to side. °· Movement of the head and opening of the mouth when the corner of the mouth or cheek is stroked (rooting). °· Increased sucking sounds, smacking lips, cooing, sighing, or  squeaking. °· Hand-to-mouth movements. °· Increased sucking of fingers or hands. ° °Late Signs of Hunger °· Fussing. °· Intermittent crying. ° °Extreme Signs of Hunger °Signs of extreme hunger will require calming and consoling before your baby will be able to breastfeed successfully. Do not wait for the following signs of extreme hunger to occur before you initiate breastfeeding: °· Restlessness. °· A loud, strong cry. °· Screaming. ° °Breastfeeding basics °Breastfeeding Initiation °· Find a comfortable place to sit or lie down, with your neck and back well supported. °· Place a pillow or rolled up blanket under your baby to bring him or her to the level of your breast (if you are seated). Nursing pillows are specially designed to help support your arms and your baby while you breastfeed. °· Make sure that your baby's abdomen is facing your abdomen. °· Gently massage your breast. With your fingertips, massage from your chest wall toward your nipple in a circular motion. This encourages milk flow. You may need to continue this action during the feeding if your milk flows slowly. °· Support your breast with 4 fingers underneath and your thumb above your nipple. Make sure your fingers are well away from your nipple and your baby’s mouth. °· Stroke your baby's lips gently with your finger or nipple. °· When your baby's mouth is open wide enough, quickly bring your baby to your breast, placing your entire nipple and as much of the colored area around your nipple (areola) as possible into your baby's mouth. °? More areola should be visible above your baby's upper lip than below the lower lip. °? Your baby's tongue should be between his or her lower gum and your breast. °· Ensure that your baby's mouth is correctly positioned around your nipple (latched). Your baby's lips should create a seal on your breast and be turned out (everted). °· It is common for your baby to suck about 2-3 minutes in order to start the flow of  breast milk. ° °Latching °Teaching your baby how to latch on to your breast properly is very important. An improper latch can cause nipple pain and decreased milk supply for you and poor weight gain in your baby. Also, if your baby is not latched onto your nipple properly, he or she may swallow some air during feeding. This can make your baby fussy. Burping your baby when you switch breasts during the feeding can help to get rid of the air. However, teaching your baby to latch on properly is still the best way to prevent fussiness from swallowing air while breastfeeding. °Signs that your baby has successfully   latched on to your nipple: °· Silent tugging or silent sucking, without causing you pain. °· Swallowing heard between every 3-4 sucks. °· Muscle movement above and in front of his or her ears while sucking. ° °Signs that your baby has not successfully latched on to nipple: °· Sucking sounds or smacking sounds from your baby while breastfeeding. °· Nipple pain. ° °If you think your baby has not latched on correctly, slip your finger into the corner of your baby’s mouth to break the suction and place it between your baby's gums. Attempt breastfeeding initiation again. °Signs of Successful Breastfeeding °Signs from your baby: °· A gradual decrease in the number of sucks or complete cessation of sucking. °· Falling asleep. °· Relaxation of his or her body. °· Retention of a small amount of milk in his or her mouth. °· Letting go of your breast by himself or herself. ° °Signs from you: °· Breasts that have increased in firmness, weight, and size 1-3 hours after feeding. °· Breasts that are softer immediately after breastfeeding. °· Increased milk volume, as well as a change in milk consistency and color by the fifth day of breastfeeding. °· Nipples that are not sore, cracked, or bleeding. ° °Signs That Your Baby is Getting Enough Milk °· Wetting at least 1-2 diapers during the first 24 hours after birth. °· Wetting  at least 5-6 diapers every 24 hours for the first week after birth. The urine should be clear or pale yellow by 5 days after birth. °· Wetting 6-8 diapers every 24 hours as your baby continues to grow and develop. °· At least 3 stools in a 24-hour period by age 5 days. The stool should be soft and yellow. °· At least 3 stools in a 24-hour period by age 7 days. The stool should be seedy and yellow. °· No loss of weight greater than 10% of birth weight during the first 3 days of age. °· Average weight gain of 4-7 ounces (113-198 g) per week after age 4 days. °· Consistent daily weight gain by age 5 days, without weight loss after the age of 2 weeks. ° °After a feeding, your baby may spit up a small amount. This is common. °Breastfeeding frequency and duration °Frequent feeding will help you make more milk and can prevent sore nipples and breast engorgement. Breastfeed when you feel the need to reduce the fullness of your breasts or when your baby shows signs of hunger. This is called "breastfeeding on demand." Avoid introducing a pacifier to your baby while you are working to establish breastfeeding (the first 4-6 weeks after your baby is born). After this time you may choose to use a pacifier. Research has shown that pacifier use during the first year of a baby's life decreases the risk of sudden infant death syndrome (SIDS). °Allow your baby to feed on each breast as long as he or she wants. Breastfeed until your baby is finished feeding. When your baby unlatches or falls asleep while feeding from the first breast, offer the second breast. Because newborns are often sleepy in the first few weeks of life, you may need to awaken your baby to get him or her to feed. °Breastfeeding times will vary from baby to baby. However, the following rules can serve as a guide to help you ensure that your baby is properly fed: °· Newborns (babies 4 weeks of age or younger) may breastfeed every 1-3 hours. °· Newborns should not go  longer than 3 hours during   the day or 5 hours during the night without breastfeeding. °· You should breastfeed your baby a minimum of 8 times in a 24-hour period until you begin to introduce solid foods to your baby at around 6 months of age. ° °Breast milk pumping °Pumping and storing breast milk allows you to ensure that your baby is exclusively fed your breast milk, even at times when you are unable to breastfeed. This is especially important if you are going back to work while you are still breastfeeding or when you are not able to be present during feedings. Your lactation consultant can give you guidelines on how long it is safe to store breast milk. °A breast pump is a machine that allows you to pump milk from your breast into a sterile bottle. The pumped breast milk can then be stored in a refrigerator or freezer. Some breast pumps are operated by hand, while others use electricity. Ask your lactation consultant which type will work best for you. Breast pumps can be purchased, but some hospitals and breastfeeding support groups lease breast pumps on a monthly basis. A lactation consultant can teach you how to hand express breast milk, if you prefer not to use a pump. °Caring for your breasts while you breastfeed °Nipples can become dry, cracked, and sore while breastfeeding. The following recommendations can help keep your breasts moisturized and healthy: °· Avoid using soap on your nipples. °· Wear a supportive bra. Although not required, special nursing bras and tank tops are designed to allow access to your breasts for breastfeeding without taking off your entire bra or top. Avoid wearing underwire-style bras or extremely tight bras. °· Air dry your nipples for 3-4 minutes after each feeding. °· Use only cotton bra pads to absorb leaked breast milk. Leaking of breast milk between feedings is normal. °· Use lanolin on your nipples after breastfeeding. Lanolin helps to maintain your skin's normal moisture  barrier. If you use pure lanolin, you do not need to wash it off before feeding your baby again. Pure lanolin is not toxic to your baby. You may also hand express a few drops of breast milk and gently massage that milk into your nipples and allow the milk to air dry. ° °In the first few weeks after giving birth, some women experience extremely full breasts (engorgement). Engorgement can make your breasts feel heavy, warm, and tender to the touch. Engorgement peaks within 3-5 days after you give birth. The following recommendations can help ease engorgement: °· Completely empty your breasts while breastfeeding or pumping. You may want to start by applying warm, moist heat (in the shower or with warm water-soaked hand towels) just before feeding or pumping. This increases circulation and helps the milk flow. If your baby does not completely empty your breasts while breastfeeding, pump any extra milk after he or she is finished. °· Wear a snug bra (nursing or regular) or tank top for 1-2 days to signal your body to slightly decrease milk production. °· Apply ice packs to your breasts, unless this is too uncomfortable for you. °· Make sure that your baby is latched on and positioned properly while breastfeeding. ° °If engorgement persists after 48 hours of following these recommendations, contact your health care provider or a lactation consultant. °Overall health care recommendations while breastfeeding °· Eat healthy foods. Alternate between meals and snacks, eating 3 of each per day. Because what you eat affects your breast milk, some of the foods may make your baby more irritable than   usual. Avoid eating these foods if you are sure that they are negatively affecting your baby. °· Drink milk, fruit juice, and water to satisfy your thirst (about 10 glasses a day). °· Rest often, relax, and continue to take your prenatal vitamins to prevent fatigue, stress, and anemia. °· Continue breast self-awareness checks. °· Avoid  chewing and smoking tobacco. Chemicals from cigarettes that pass into breast milk and exposure to secondhand smoke may harm your baby. °· Avoid alcohol and drug use, including marijuana. °Some medicines that may be harmful to your baby can pass through breast milk. It is important to ask your health care provider before taking any medicine, including all over-the-counter and prescription medicine as well as vitamin and herbal supplements. °It is possible to become pregnant while breastfeeding. If birth control is desired, ask your health care provider about options that will be safe for your baby. °Contact a health care provider if: °· You feel like you want to stop breastfeeding or have become frustrated with breastfeeding. °· You have painful breasts or nipples. °· Your nipples are cracked or bleeding. °· Your breasts are red, tender, or warm. °· You have a swollen area on either breast. °· You have a fever or chills. °· You have nausea or vomiting. °· You have drainage other than breast milk from your nipples. °· Your breasts do not become full before feedings by the fifth day after you give birth. °· You feel sad and depressed. °· Your baby is too sleepy to eat well. °· Your baby is having trouble sleeping. °· Your baby is wetting less than 3 diapers in a 24-hour period. °· Your baby has less than 3 stools in a 24-hour period. °· Your baby's skin or the white part of his or her eyes becomes yellow. °· Your baby is not gaining weight by 5 days of age. °Get help right away if: °· Your baby is overly tired (lethargic) and does not want to wake up and feed. °· Your baby develops an unexplained fever. °This information is not intended to replace advice given to you by your health care provider. Make sure you discuss any questions you have with your health care provider. °Document Released: 03/15/2005 Document Revised: 08/27/2015 Document Reviewed: 09/06/2012 °[employer] Interactive Patient Education © 2017 [employer] ° ° °

## 2016-03-21 NOTE — Progress Notes (Signed)
Both parents viewed the Period of Purple Cry Video prior to discharge home.

## 2016-03-23 NOTE — Anesthesia Postprocedure Evaluation (Signed)
Anesthesia Post Note  Patient: Norberto SorensonSandra Castles  Procedure(s) Performed: * No procedures listed *  Patient location during evaluation: Mother Baby Anesthesia Type: Epidural Level of consciousness: awake and alert Pain management: pain level controlled Vital Signs Assessment: post-procedure vital signs reviewed and stable Respiratory status: spontaneous breathing, nonlabored ventilation and respiratory function stable Cardiovascular status: stable Postop Assessment: no headache, no backache and epidural receding Anesthetic complications: no Comments: Report per nursing staff.  Pt discharged without apparent anesthetic difficulty.  JA     Last Vitals: There were no vitals filed for this visit.  Last Pain: There were no vitals filed for this visit.               Yevette EdwardsJames G Tanai Bouler

## 2016-06-15 ENCOUNTER — Encounter: Payer: Self-pay | Admitting: Obstetrics and Gynecology

## 2016-06-15 ENCOUNTER — Ambulatory Visit (INDEPENDENT_AMBULATORY_CARE_PROVIDER_SITE_OTHER): Payer: Medicaid Other | Admitting: Obstetrics and Gynecology

## 2016-06-15 VITALS — BP 102/50 | HR 82 | Ht 61.0 in | Wt 108.0 lb

## 2016-06-15 DIAGNOSIS — Z308 Encounter for other contraceptive management: Secondary | ICD-10-CM

## 2016-06-15 DIAGNOSIS — F53 Postpartum depression: Secondary | ICD-10-CM

## 2016-06-15 DIAGNOSIS — O99345 Other mental disorders complicating the puerperium: Secondary | ICD-10-CM

## 2016-06-15 MED ORDER — MEDROXYPROGESTERONE ACETATE 150 MG/ML IM SUSP: 150.0000 mg | INTRAMUSCULAR | 3 refills | Status: DC

## 2016-06-15 NOTE — Progress Notes (Signed)
Obstetrics & Gynecology Office Visit   Chief Complaint:  Chief Complaint  Patient presents with  . medication Follow up    History of Present Illness: Patient is a 21 y.o. [id number] presenting for contraception consult.  She is currently on NuvaRing vaginal inserts and desiring to start Depo-Provera injections.  She has a past medical history significant for no contraindication to estrogen.  She specifically denies a history of chronic hypertension, migraines with aura, history of DVT/PE, and smoking.   Patient's last menstrual period was 05/05/2016 (exact date)..    She is doing well on her current dose of Zoloft which is why she was initially counseled against depo provera.  Review of Systems: Review of Systems  Constitutional: Negative for chills and fever.  HENT: Negative for congestion.   Respiratory: Negative for cough and shortness of breath.   Cardiovascular: Negative for chest pain and palpitations.  Gastrointestinal: Negative for abdominal pain, constipation, diarrhea, heartburn, nausea and vomiting.  Genitourinary: Negative for dysuria, frequency and urgency.  Skin: Negative for itching and rash.  Neurological: Negative for dizziness and headaches.  Endo/Heme/Allergies: Negative for polydipsia.  Psychiatric/Behavioral: Negative for depression.    Past Medical History:  Past Medical History:  Diagnosis Date  . Anemia   . H/O rubella   . H/O varicella   . Hx of ovarian cyst   . No pertinent past medical history   . Yeast infection     Past Surgical History:  Past Surgical History:  Procedure Laterality Date  . NO PAST SURGERIES      Gynecologic History: Patient's last menstrual period was 05/05/2016 (exact date).  Obstetric History: G2P1001  Family History:  Family History  Problem Relation Age of Onset  . Alcohol abuse Mother   . Depression Mother   . Alcohol abuse Father   . Diabetes Maternal Grandmother   . Mental retardation Cousin   . Autism  Cousin     Social History:  Social History   Social History  . Marital status: Legally Separated    Spouse name: N/A  . Number of children: N/A  . Years of education: N/A   Occupational History  . Not on file.   Social History Main Topics  . Smoking status: Never Smoker  . Smokeless tobacco: Never Used  . Alcohol use No  . Drug use: No  . Sexual activity: Yes    Birth control/ protection: None, Injection     Comment: currently pregnant   Other Topics Concern  . Not on file   Social History Narrative  . No narrative on file    Allergies:  Allergies  Allergen Reactions  . Penicillins Hives    Medications: Prior to Admission medications   Medication Sig Start Date End Date Taking? Authorizing Provider  sertraline (ZOLOFT) 100 MG tablet Take 100 mg by mouth daily.   Yes Historical Provider, MD  medroxyPROGESTERone (DEPO-PROVERA) 150 MG/ML injection Inject 1 mL (150 mg total) into the muscle every 3 (three) months. 06/15/16 09/13/16  Vena AustriaAndreas Demareon Coldwell, MD    Physical Exam Vitals:  Vitals:   06/15/16 1544  BP: (!) 102/50  Pulse: 82   Patient's last menstrual period was 05/05/2016 (exact date).  General: NAD HEENT: normocephalic, anicteric Pulmonary: No increased work of breathing Extremities: no edema, erythema, or tenderness Neurologic: Grossly intact Psychiatric: mood appropriate, affect full    Assessment: 21 y.o. [id number] presenting for contraception consult  Plan: Problem List Items Addressed This Visit    None  Visit Diagnoses    Encounter for other contraceptive management    -  Primary   Postpartum depression       Relevant Medications   sertraline (ZOLOFT) 100 MG tablet      1) Reviewed all forms of birth control options available including abstinence; over the counter/barrier methods; hormonal contraceptive medication including pill, patch, ring, injection,contraceptive implant; hormonal and nonhormonal IUDs; permanent sterilization  options including vasectomy and the various tubal sterilization modalities. Risks and benefits reviewed.  Questions were answered.  Information was given to patient to review.   2) Patient opts for depo-provera for contraception.    She understands that Depo-Provera is a progesterone only therapy, and that patients often patients have irregular and unpredictable vaginal bleeding or amenorrhea. She understands that other side effects are possible related to systemic progesterone, including but not limited to, headaches, breast tenderness, nausea, and irritability. While effective at preventing pregnancy Depo-Provera does not prevent transmission of sexually transmitted diseases and use of barrier methods for this purpose was discussed.  She is aware of the need to return every 3 months for re-dosing.  We also discussed the FDA black box warning regarding long term use (greater than 2 years) and bone loss.  3) Postpartum depression - continue Zoloft 100mg  po daily  4) A total of 15 minutes were spent in face-to-face contact with the patient during this encounter with over half of that time devoted to counseling and coordination of care.

## 2016-06-15 NOTE — Patient Instructions (Signed)
Hormonal Contraception Information Hormonal contraception is a type of birth control that uses hormones to prevent pregnancy. It usually involves a combination of the hormones estrogen and progesterone or only the hormone progesterone. Hormonal contraception works in these ways:  It thickens the mucus in the cervix, making it harder for sperm to enter the uterus.  It changes the lining of the uterus, making it harder for an egg to implant.  It may stop the ovaries from releasing eggs (ovulation). Some women who take hormonal contraceptives that contain only progesterone may continue to ovulate. Hormonal contraception cannot prevent sexually transmitted infections (STIs). Pregnancy may still occur. Estrogen and progesterone contraceptives  Contraceptives that use a combination of estrogen and progesterone are available in these forms:  Pill. Pills come in different combinations of hormones. They must be taken at the same time each day. Pills can affect your period, causing you to get your period once every three months or not at all.  Patch. The patch must be worn on the lower abdomen for three weeks and then removed on the fourth.  Vaginal ring. The ring is placed in the vagina and left there for three weeks. It is then removed for one week. Progesterone contraceptives Contraceptives that use progesterone only are available in these forms:  Pill. Pills should be taken every day of the cycle.  Intrauterine device (IUD). This device is inserted into the uterus and removed or replaced every five years or sooner.  Implant. Plastic rods are placed under the skin of the upper arm. They are removed or replaced every three years or sooner.  Injection. The injection is given once every 90 days. What are the side effects? The side effects of estrogen and progesterone contraceptives include:  Nausea.  Headaches.  Breast tenderness.  Bleeding or spotting between menstrual cycles.  High  blood pressure (rare).  Strokes, heart attacks, or blood clots (rare) Side effects of progesterone-only contraceptives include:  Nausea.  Headaches.  Breast tenderness.  Unpredictable menstrual bleeding.  High blood pressure (rare). Talk to your health care provider about what side effects may affect you. Where to find more information:  Ask your health care provider for more information and resources about hormonal contraception.  U.S[place] Department of Health and Human Services Office on Women's Health: www.womenshealth.gov Questions to ask:  What type of hormonal contraception is right for me?  How long should I plan to use hormonal contraception?  What are the side effects of the hormonal contraception method I choose?  How can I prevent STIs while using hormonal contraception? Contact a health care provider if:  You start taking hormonal contraceptives and you develop persistent or severe side effects. Summary  Estrogen and progesterone are hormones used in many forms of birth control.  Talk to your health care provider about what side effects may affect you.  Hormonal contraception cannot prevent sexually transmitted infections (STIs).  Ask your health care provider for more information and resources about hormonal contraception. This information is not intended to replace advice given to you by your health care provider. Make sure you discuss any questions you have with your health care provider. Document Released: 04/04/2007 Document Revised: 02/13/2016 Document Reviewed: 02/13/2016 Elsevier Interactive Patient Education  2017 Elsevier Inc.  

## 2016-06-16 MED ORDER — SERTRALINE HCL 100 MG PO TABS: 100.0000 mg | ORAL_TABLET | Freq: Every day | ORAL | 6 refills | Status: DC

## 2016-06-17 ENCOUNTER — Ambulatory Visit (INDEPENDENT_AMBULATORY_CARE_PROVIDER_SITE_OTHER): Payer: Medicaid Other

## 2016-06-17 DIAGNOSIS — Z3042 Encounter for surveillance of injectable contraceptive: Secondary | ICD-10-CM | POA: Diagnosis not present

## 2016-06-17 DIAGNOSIS — Z308 Encounter for other contraceptive management: Secondary | ICD-10-CM

## 2016-06-17 DIAGNOSIS — Z3202 Encounter for pregnancy test, result negative: Secondary | ICD-10-CM

## 2016-06-17 LAB — POCT URINE PREGNANCY: PREG TEST UR: NEGATIVE

## 2016-06-17 MED ORDER — MEDROXYPROGESTERONE ACETATE 150 MG/ML IM SUSP
150.0000 mg | Freq: Once | INTRAMUSCULAR | Status: AC
  Administered 2016-06-17: 150 mg via INTRAMUSCULAR

## 2016-09-09 ENCOUNTER — Ambulatory Visit: Payer: Self-pay

## 2016-11-09 ENCOUNTER — Emergency Department: Payer: Medicaid Other

## 2016-11-09 ENCOUNTER — Emergency Department
Admission: EM | Admit: 2016-11-09 | Discharge: 2016-11-10 | Disposition: A | Discharge status: Home / Self Care | Payer: Medicaid Other | Attending: Emergency Medicine | Admitting: Emergency Medicine

## 2016-11-09 DIAGNOSIS — G501 Atypical facial pain: Secondary | ICD-10-CM | POA: Diagnosis present

## 2016-11-09 DIAGNOSIS — W010XXA Fall on same level from slipping, tripping and stumbling without subsequent striking against object, initial encounter: Secondary | ICD-10-CM | POA: Diagnosis not present

## 2016-11-09 DIAGNOSIS — S0511XA Contusion of eyeball and orbital tissues, right eye, initial encounter: Secondary | ICD-10-CM

## 2016-11-09 DIAGNOSIS — Z79899 Other long term (current) drug therapy: Secondary | ICD-10-CM | POA: Insufficient documentation

## 2016-11-09 DIAGNOSIS — Y9301 Activity, walking, marching and hiking: Secondary | ICD-10-CM | POA: Diagnosis not present

## 2016-11-09 DIAGNOSIS — Y998 Other external cause status: Secondary | ICD-10-CM | POA: Diagnosis not present

## 2016-11-09 DIAGNOSIS — S0011XA Contusion of right eyelid and periocular area, initial encounter: Secondary | ICD-10-CM | POA: Insufficient documentation

## 2016-11-09 DIAGNOSIS — Y929 Unspecified place or not applicable: Secondary | ICD-10-CM | POA: Insufficient documentation

## 2016-11-09 NOTE — ED Notes (Signed)
Patient to ED for right eye injury and right hip pain after falling down six indoor basement steps. Denies LOC but felt "woozy" after the fall. Right eye is currently bruising. Felt nauseated after the fall.

## 2016-11-09 NOTE — ED Triage Notes (Addendum)
Patient ambulatory to triage with steady gait, without difficulty or distress noted; pt reports falling down steps at 730pm; hematoma to right eye/cheek with tenderness; no cervical tenderness; denies LOC but c/o generalized HA; visual acuity 20/25 bilat

## 2016-11-10 MED ORDER — IBUPROFEN 600 MG PO TABS
600.0000 mg | ORAL_TABLET | Freq: Once | ORAL | Status: AC
  Administered 2016-11-10: 600 mg via ORAL
  Filled 2016-11-10: qty 1

## 2016-11-10 NOTE — ED Provider Notes (Signed)
[facility] Emergency Department Provider Note    First MD Initiated Contact with Patient 11/10/16 0000     (approximate)  I have reviewed the triage vital signs and the nursing notes.   HISTORY  Chief Complaint Fall    HPI Paulette Rockford is a 21 y.o. female with below list of chronic medical presents to the emergency department status post axial fall with subsequent right-sided facial trauma and 7:30 PM tonight. Patient denies any loss of consciousness however does admit to 5 out of 10 right facial discomfort. Patient denies any visual changes at this time. Patient denies any weakness numbness gait instability.   Past Medical History:  Diagnosis Date  . Anemia   . H/O rubella   . H/O varicella   . Hx of ovarian cyst   . No pertinent past medical history   . Yeast infection     Patient Active Problem List   Diagnosis Date Noted  . Irregular uterine contractions 03/19/2016  . Normal labor 03/19/2016  . Pre-eclampsia 11/28/2011  . Vaginal delivery 11/27/2011  . Perineal laceration during delivery 11/27/2011  . Post-term pregnancy, 40-42 weeks of gestation 11/26/2011  . Abnormal quad screen 07/27/2011  . Late prenatal care 06/10/2011  . Teen pregnancy 06/10/2011    Past Surgical History:  Procedure Laterality Date  . NO PAST SURGERIES      Prior to Admission medications   Medication Sig Start Date End Date Taking? Authorizing Provider  medroxyPROGESTERone (DEPO-PROVERA) 150 MG/ML injection Inject 1 mL (150 mg total) into the muscle every 3 (three) months. 06/15/16 09/13/16  Vena Austria, MD  sertraline (ZOLOFT) 100 MG tablet Take 1 tablet (100 mg total) by mouth daily. 06/16/16   Vena Austria, MD    Allergies Penicillins  Family History  Problem Relation Age of Onset  . Alcohol abuse Mother   . Depression Mother   . Alcohol abuse Father   . Diabetes Maternal Grandmother   . Mental retardation Cousin   . Autism Cousin      Social History Social History  Substance Use Topics  . Smoking status: Never Smoker  . Smokeless tobacco: Never Used  . Alcohol use No    Review of Systems Constitutional: No fever/chills Eyes: No visual changes. ENT: No sore throat. Cardiovascular: Denies chest pain. Respiratory: Denies shortness of breath. Gastrointestinal: No abdominal pain.  No nausea, no vomiting.  No diarrhea.  No constipation. Genitourinary: Negative for dysuria. Musculoskeletal: Negative for neck pain.  Negative for back pain. Integumentary: Negative for rash. Neurological: Negative for headaches, focal weakness or numbness.   ____________________________________________   PHYSICAL EXAM:  VITAL SIGNS: ED Triage Vitals  Enc Vitals Group     BP 11/10/16 0006 106/73     Pulse Rate 11/10/16 0006 80     Resp 11/10/16 0006 18     Temp --      Temp src --      SpO2 11/10/16 0006 100 %     Weight 11/09/16 2243 54.4 kg (120 lb)     Height 11/09/16 2243 1.549 m (5\' 1" )     Head Circumference --      Peak Flow --      Pain Score 11/09/16 2243 5     Pain Loc --      Pain Edu? --      Excl. in GC? --     Constitutional: Alert and oriented. Well appearing and in no acute distress. Eyes: Conjunctivae are normal. PERRL.  EOMI. Head: right periorbital ecchymoses and swelling Nose: No congestion/rhinnorhea. Mouth/Throat: Mucous membranes are moist. Oropharynx non-erythematous. Neck: No stridor.  No cervical spine tenderness to palpation. Cardiovascular: Normal rate, regular rhythm. Good peripheral circulation. Grossly normal heart sounds. Respiratory: Normal respiratory effort.  No retractions. Lungs CTAB. Gastrointestinal: Soft and nontender. No distention.  Musculoskeletal: No lower extremity tenderness nor edema. No gross deformities of extremities. Neurologic:  Normal speech and language. No gross focal neurologic deficits are appreciated.  Skin:  Right periorbital ecchymoses and  swelling Psychiatric: Mood and affect are normal. Speech and behavior are normal.  __________ RADIOLOGY I, Clover N Dezirae Service, personally viewed and evaluated these images (plain radiographs) as part of my medical decision making, as well as reviewing the written report by the radiologist.  Ct Head Wo Contrast  Result Date: 11/09/2016 CLINICAL DATA:  Status post fall down 6 basement steps, with right orbital injury. Nausea and dizziness. Concern for head injury. Initial encounter. EXAM: CT HEAD WITHOUT CONTRAST CT MAXILLOFACIAL WITHOUT CONTRAST TECHNIQUE: Multidetector CT imaging of the head and maxillofacial structures were performed using the standard protocol without intravenous contrast. Multiplanar CT image reconstructions of the maxillofacial structures were also generated. COMPARISON:  CT of the head performed 02/28/2010 FINDINGS: CT HEAD FINDINGS Brain: No evidence of acute infarction, hemorrhage, hydrocephalus, extra-axial collection or mass lesion/mass effect. The posterior fossa, including the cerebellum, brainstem and fourth ventricle, is within normal limits. The third and lateral ventricles, and basal ganglia are unremarkable in appearance. The cerebral hemispheres are symmetric in appearance, with normal gray-white differentiation. No mass effect or midline shift is seen. Vascular: No hyperdense vessel or unexpected calcification. Skull: There is no evidence of fracture; visualized osseous structures are unremarkable in appearance. Other: No significant soft tissue abnormalities are seen. CT MAXILLOFACIAL FINDINGS Osseous: There is no evidence of fracture or dislocation. The maxilla and mandible appear intact. The nasal bone is unremarkable in appearance. The visualized dentition demonstrates no acute abnormality. Orbits: The orbits are intact bilaterally. Sinuses: A mucus retention cyst or polyp is noted at the right maxillary sinus. There is mucosal thickening at the right side of the  sphenoid sinus. The remaining visualized paranasal sinuses and mastoid air cells are well-aerated. Soft tissues: Soft tissue swelling is noted overlying the right maxilla and about the right orbit. A metallic piercing is noted at the tongue. The parapharyngeal fat planes are preserved. The nasopharynx, oropharynx and hypopharynx are unremarkable in appearance. The visualized portions of the valleculae and piriform sinuses are grossly unremarkable. The parotid and submandibular glands are within normal limits. No cervical lymphadenopathy is seen. IMPRESSION: 1. No evidence of traumatic intracranial injury or fracture. 2. No evidence of fracture or dislocation with regard to the maxillofacial structures. 3. Soft tissue swelling overlying the right maxilla and about the right orbit. 4. Mucus retention cyst or polyp at the right maxillary sinus, and mucosal thickening at the right side of the sphenoid sinus. Electronically Signed   By: Roanna Raider M.D.   On: 11/09/2016 23:13   Ct Maxillofacial Wo Contrast  Result Date: 11/09/2016 CLINICAL DATA:  Status post fall down 6 basement steps, with right orbital injury. Nausea and dizziness. Concern for head injury. Initial encounter. EXAM: CT HEAD WITHOUT CONTRAST CT MAXILLOFACIAL WITHOUT CONTRAST TECHNIQUE: Multidetector CT imaging of the head and maxillofacial structures were performed using the standard protocol without intravenous contrast. Multiplanar CT image reconstructions of the maxillofacial structures were also generated. COMPARISON:  CT of the head performed 02/28/2010 FINDINGS: CT HEAD FINDINGS  Brain: No evidence of acute infarction, hemorrhage, hydrocephalus, extra-axial collection or mass lesion/mass effect. The posterior fossa, including the cerebellum, brainstem and fourth ventricle, is within normal limits. The third and lateral ventricles, and basal ganglia are unremarkable in appearance. The cerebral hemispheres are symmetric in appearance, with  normal gray-white differentiation. No mass effect or midline shift is seen. Vascular: No hyperdense vessel or unexpected calcification. Skull: There is no evidence of fracture; visualized osseous structures are unremarkable in appearance. Other: No significant soft tissue abnormalities are seen. CT MAXILLOFACIAL FINDINGS Osseous: There is no evidence of fracture or dislocation. The maxilla and mandible appear intact. The nasal bone is unremarkable in appearance. The visualized dentition demonstrates no acute abnormality. Orbits: The orbits are intact bilaterally. Sinuses: A mucus retention cyst or polyp is noted at the right maxillary sinus. There is mucosal thickening at the right side of the sphenoid sinus. The remaining visualized paranasal sinuses and mastoid air cells are well-aerated. Soft tissues: Soft tissue swelling is noted overlying the right maxilla and about the right orbit. A metallic piercing is noted at the tongue. The parapharyngeal fat planes are preserved. The nasopharynx, oropharynx and hypopharynx are unremarkable in appearance. The visualized portions of the valleculae and piriform sinuses are grossly unremarkable. The parotid and submandibular glands are within normal limits. No cervical lymphadenopathy is seen. IMPRESSION: 1. No evidence of traumatic intracranial injury or fracture. 2. No evidence of fracture or dislocation with regard to the maxillofacial structures. 3. Soft tissue swelling overlying the right maxilla and about the right orbit. 4. Mucus retention cyst or polyp at the right maxillary sinus, and mucosal thickening at the right side of the sphenoid sinus. Electronically Signed   By: Roanna RaiderJeffery  Chang M.D.   On: 11/09/2016 23:13     Procedures   ____________________________________________   INITIAL IMPRESSION / ASSESSMENT AND PLAN / ED COURSE  Pertinent labs & imaging results that were available during my care of the patient were reviewed by me and considered in my  medical decision making (see chart for details).  Ice pack applied, ibuprofen given.      ____________________________________________  FINAL CLINICAL IMPRESSION(S) / ED DIAGNOSES  Final diagnoses:  Periorbital contusion of right eye, initial encounter     MEDICATIONS GIVEN DURING THIS VISIT:  Medications - No data to display   NEW OUTPATIENT MEDICATIONS STARTED DURING THIS VISIT:  New Prescriptions   No medications on file    Modified Medications   No medications on file    Discontinued Medications   No medications on file     Note:  This document was prepared using Dragon voice recognition software and may include unintentional dictation errors.    Darci CurrentBrown, [place] N, MD 11/10/16 [id number]

## 2016-11-10 NOTE — ED Notes (Addendum)
Pt states that she went into basement to wash clothes and as she was going back up the stairs she slipped and fell and hit face on concrete floor. Mother at bedside. Pt has a bruised right eye and pt states that her vision on that side was blurry from "puffiness". Pt reports dizziness shortly after, but it subsided.

## 2016-11-10 NOTE — ED Notes (Signed)
Ice bag given to patient. 

## 2016-11-10 NOTE — ED Notes (Signed)
Dr. Brown at bedside

## 2016-11-10 NOTE — ED Notes (Signed)
Patient verbalizes understanding of d/c instructions and follow-up. VS stable and pain controlled per patient.  Patient in NAD at time of d/c and denies further concerns regarding this visit. Patient stable at the time of departure from the unit, departing unit by the safest and most appropriate manner per that patients condition and limitations. Patient advised to return to the ED at any time for emergent concerns, or for new/worsening symptoms.   

## 2017-01-05 ENCOUNTER — Encounter (HOSPITAL_COMMUNITY): Payer: Self-pay | Admitting: *Deleted

## 2017-01-05 ENCOUNTER — Inpatient Hospital Stay (HOSPITAL_COMMUNITY)
Admission: AD | Admit: 2017-01-05 | Discharge: 2017-01-05 | Disposition: A | Discharge status: Home / Self Care | Payer: Medicaid Other | Source: Ambulatory Visit | Attending: Obstetrics and Gynecology | Admitting: Obstetrics and Gynecology

## 2017-01-05 DIAGNOSIS — Z3201 Encounter for pregnancy test, result positive: Secondary | ICD-10-CM | POA: Diagnosis not present

## 2017-01-05 DIAGNOSIS — Z3A01 Less than 8 weeks gestation of pregnancy: Secondary | ICD-10-CM | POA: Insufficient documentation

## 2017-01-05 LAB — POCT PREGNANCY, URINE: Preg Test, Ur: POSITIVE — AB

## 2017-01-05 NOTE — MAU Provider Note (Signed)
S:  [name] is a 21 y.o. female G3P1001 @ [redacted]w[redacted]d by uncertain LMP here in MAU for an an Ultrasound. States she was at the pregnancy care center today and had a transabdominal US that showed a gestational sack only. She was sent here for further workup. She denies pain or bleeding at this time. She is scheduled for her first prenatal appointment next Monday at [facility] in [place].   O:  GENERAL: Well-developed, well-nourished female in no acute distress.  LUNGS: Effort normal SKIN: Warm, dry and without erythema PSYCH: Normal mood and affect  Vitals:   01/05/17 1313  BP: 108/61  Pulse: 76  Resp: 16  Temp: 98.2 F (36.8 C)  SpO2: 100%    MDM:  Results for orders placed or performed during the hospital encounter of 01/05/17 (from the past 72 hour(s))  Pregnancy, urine POC     Status: Abnormal   Collection Time: 01/05/17  1:27 PM  Result Value Ref Range   Preg Test, Ur POSITIVE (A) NEGATIVE    Comment:        THE SENSITIVITY OF THIS METHODOLOGY IS >24 mIU/mL      A:  1. Encounter for pregnancy test, result positive     P:  Discharge home in stable condition Follow up with OB as scheduled Return to MAU with any new onset of abdominal pain or vaginal bleeding Prenatal vitamins daily Patient declined [place] here in the next 1 week, patient would like to wait and see her OB next week.   Venia Carbon I, NP 01/05/2017 1:25 PM

## 2017-01-05 NOTE — Discharge Instructions (Signed)
Pregnancy, The Father's Role A father has an important role during his partners pregnancy, labor, delivery, and after the birth of the baby. It is important to help and support your partner through this new period. There are many physical and emotional changes that happen. To be helpful and supportive during this time, you should know and understand what is happening to your partner during pregnancy, labor, delivery, and after the baby is born. What are the stages of pregnancy? Pregnancy usually lasts about 40 weeks. The pregnancy is divided into three trimesters. First Trimester During the first 13 weeks, your partner may:  Feel tired.  Have painful breasts.  Feel nauseous or throw up.  Urinate more often.  Have mood changes.  All of these changes are normal. If they are happening, try to be helpful, supportive, and understanding. This may include helping with household duties and activities and spending more time with each other. Second Trimester During the next 14-28 weeks:  Your partner will likely feel better and more energetic.  This is the best time of the pregnancy to be more active together.  You will be able to see her belly showing the pregnancy.  You may be able to feel the baby kick.  Your partner may have soreness or aching in her back as she gains weight. You can help her by carrying heavy things and by rubbing her back when she is feeling sore.  Third Trimester During the final 12 weeks, your partner may:  Become more uncomfortable as the baby grows.  Have a hard time doing everyday activities, and her balance may be off.  Have a hard time bending over.  Tire easily.  Have difficulty sleeping.  At this time, the birth of your baby is close. You and your partner may have concerns or questions. This is normal. Talk with each other and with your health care provider. Continue to help your partner with housework, encourage her to rest, and rub her sore back  and legs, if this helps her. What can I expect or do during the pregnancy? You can expect to experience some changes. There are also many things you can do to help prepare you and your partner for your baby. Emotional Changes During your partner's pregnancy, emotional changes for you may include:  Having feelings of happiness, excitement, and pride.  Being concerned about having new responsibilities, such as financial or educational responsibilities.  Feeling overwhelmed or scared.  Being worried that a baby will change your relationship with your partner.  These feelings are normal. Talk about them openly with your partner and your health care provider. Prenatal Care Attend prenatal care visits with your partner. This is a good time for you to get to know your health care provider, follow the pregnancy, and ask questions.  Prenatal visits usually occur one time each month for six months, then every two weeks for two months, and then one time each week during the last month. You may have more prenatal visits if your health care provider believes this is needed.  Your health care provider usually does an ultrasound of the baby at one of the prenatal visits. This may happen more often if your health care provider thinks it is needed.  Sexual Activity Sexual intercourse is safe unless there is a problem with the pregnancy and your health care provider advises you to not have sexual intercourse. Because physical and emotional changes happen in pregnancy, your partner may not want to have sex during certain  times. Trying different positions may make sexual intercourse more comfortable. However, always respect your partners decision if she does not want to have sex. It is important for both of you to discuss your feelings and desires. Talk with your health care provider about any questions that you may have about sexual intercourse during pregnancy. Childbirth Classes Attend childbirth classes  with your partner if you are able. Classes prepare you and help you to understand what happens during labor and delivery, and they help you and your partner to bond. There are even some classes that are only for new fathers. Classes also teach you and your partner:  Various relaxation techniques.  How to work with her labor pains.  How to focus during labor and delivery.  What should I know about labor and delivery? Many fathers want to be present while their partner is going through labor and delivery. You may:  Be asked to time the contractions, massage your partners back, and breathe with her during the contractions.  Get to see and enjoy the excitement of your baby being born, and you may even be able to cut your babys umbilical cord. If you feel like you might faint or you are uncomfortable, ask someone to help you.  Need to leave the room if a problem develops during labor or delivery.  A cesarean delivery, or C-section, is a procedure that may be used to deliver the baby. It is done through an incision in the abdomen and the uterus. A cesarean delivery may be scheduled or it may be an emergency procedure during labor and delivery. Most hospitals allow the father to be in the room for a cesarean delivery unless it is an emergency. Recovery from a cesarean delivery usually requires more help from the father. What happens after delivery? After your baby is born, your partner will go through many changes again. These changes could last a few months or longer. Postpartum Depression Your partner may take awhile to regain her strength. She may also have feelings of sadness (postpartum blues or postpartum depression). If your partner is acting unusually sad or depressed, talk with your health care provider right away. This can be a serious medical condition that requires treatment. Breastfeeding Your partner may decide to breastfeed the baby. This helps with bonding between the mother and the  baby, and breast milk is the best nutrition for your baby. You can feel included by burping the baby and bottle-feeding the baby with breast milk that was collected from the mother. This allows your partner to rest and helps you to bond with your baby. Sexual Activity It may take a few months for your partners body to heal and be ready for sexual intercourse again. This may take longer after a cesarean delivery. If you have any questions about having sexual intercourse or if it is painful for your partner, talk with your health care provider. It is possible for breastfeeding mothers to become pregnant even if they are not having menstrual periods. Use birth control (contraception) unless you and your partner would like to become pregnant again. What should I remember? Fatherhood and having a baby is an ongoing learning experience. It is common to be anxious, concerned, or afraid that you may not be taking care of your newborn baby properly. It is important to talk with your partner and your health care provider if you are worried or have any questions. This information is not intended to replace advice given to you by your health  care provider. Make sure you discuss any questions you have with your health care provider. Document Released: 09/01/2007 Document Revised: 08/18/2015 Document Reviewed: 11/30/2013 Elsevier Interactive Patient Education  2017 [employer]

## 2017-01-05 NOTE — MAU Note (Addendum)
Was seen at the [facility] today Received EDD to 07/31/17 States had an ultrasound there but was sent here for a vaginal ultrasound  LMP 8/4-8/12 (states was a normal period) Then had bleeding on 9/1-9/4  Denies vaginal bleeding or discharge.   States has has regular period like cramps here or there. No pain at present. States she feels fine.   Has an appointment on Monday with [facility]

## 2017-01-10 ENCOUNTER — Encounter: Payer: Self-pay | Admitting: Advanced Practice Midwife

## 2017-01-10 ENCOUNTER — Ambulatory Visit (INDEPENDENT_AMBULATORY_CARE_PROVIDER_SITE_OTHER): Payer: Medicaid Other | Admitting: Advanced Practice Midwife

## 2017-01-10 VITALS — BP 114/70 | Wt 108.0 lb

## 2017-01-10 DIAGNOSIS — Z3480 Encounter for supervision of other normal pregnancy, unspecified trimester: Secondary | ICD-10-CM

## 2017-01-10 DIAGNOSIS — O0990 Supervision of high risk pregnancy, unspecified, unspecified trimester: Secondary | ICD-10-CM | POA: Insufficient documentation

## 2017-01-10 DIAGNOSIS — Z113 Encounter for screening for infections with a predominantly sexual mode of transmission: Secondary | ICD-10-CM

## 2017-01-10 NOTE — Progress Notes (Signed)
New Obstetric Patient H&P    Chief Complaint: "Desires prenatal care"   History of Present Illness: Patient is a 21 y.o. [id number] Not Hispanic or Latino female, LMP 10/30/2016 presents with amenorrhea and positive home pregnancy test. Based on her LMP, her EDD is Estimated Date of Delivery: 08/06/2017. and her EGA is [redacted]w[redacted]d. Cycles are 8. days, regular, and occur approximately every : 28 days. Her last pap smear was 8 months ago and was ASCUS with POSITIVE high risk HPV.    She had a urine pregnancy test which was positive 4 week(s)  ago. Her last menstrual period was normal and lasted for  8 or 9 day(s). Since her LMP she claims she has experienced breast tenderness, fatigue. She denies vaginal bleeding. Her past medical history is noncontributory. Her prior pregnancies are notable for pre-eclampsia with G1. She had a different FOB with G2 and did not have preeclampsia.  Since her LMP, she admits to the use of tobacco products  She quit with positive pregnancy test She claims she has gained   5 pounds since the start of her pregnancy.  There are cats in the home in the home  no  She admits close contact with children on a regular basis  yes  She has had chicken pox in the past yes She has had Tuberculosis exposures, symptoms, or previously tested positive for TB   no Current or past history of domestic violence. no  Genetic Screening/Teratology Counseling: (Includes patient, baby's father, or anyone in either family with:)   1. Patient's age >/= 32 at [facility]  no 2. Thalassemia ([place], [place], Mediterranean, or Asian background): MCV<80  no 3. Neural tube defect (meningomyelocele, spina bifida, anencephaly)  no 4. Congenital heart defect  no  5. Down syndrome  no 6. Tay-Sachs (Jewish, [place]  no 7. Canavan's Disease  no 8. Sickle cell disease or trait (African)  no  9. Hemophilia or other blood disorders  no  10. Muscular dystrophy  no  11. Cystic fibrosis  no  12. Huntington's  Chorea  no  13. Mental retardation/autism  no 14. Other inherited genetic or chromosomal disorder  no 15. Maternal metabolic disorder (DM, PKU, etc)  no 16. Patient or FOB with a child with a birth defect not listed above no  16a. Patient or FOB with a birth defect themselves no 17. Recurrent pregnancy loss, or stillbirth  no  18. Any medications since LMP other than prenatal vitamins (include vitamins, supplements, OTC meds, drugs, alcohol)  no 19. Any other genetic/environmental exposure to discuss  no  Infection History:   1. Lives with someone with TB or TB exposed  no  2. Patient or partner has history of genital herpes  no 3. Rash or viral illness since LMP  no 4. History of STI (GC, CT, HPV, syphilis, HIV)  no 5. History of recent travel :  no  Other pertinent information:  no     Review of Systems:10 point review of systems negative unless otherwise noted in HPI  Past Medical History:  Past Medical History:  Diagnosis Date  . Anemia   . H/O rubella   . H/O varicella   . Hx of ovarian cyst   . No pertinent past medical history   . Yeast infection     Past Surgical History:  Past Surgical History:  Procedure Laterality Date  . NO PAST SURGERIES      Gynecologic History: Patient's last menstrual period was 10/30/2016.  Obstetric  History: [id number]  Family History:  Family History  Problem Relation Age of Onset  . Alcohol abuse Mother   . Depression Mother   . Alcohol abuse Father   . Diabetes Maternal Grandmother   . Mental retardation Cousin   . Autism Cousin     Social History:  Social History   Social History  . Marital status: Legally Separated    Spouse name: N/A  . Number of children: N/A  . Years of education: N/A   Occupational History  . Not on file.   Social History Main Topics  . Smoking status: Never Smoker  . Smokeless tobacco: Never Used  . Alcohol use No  . Drug use: No  . Sexual activity: Yes    Birth control/ protection:  None     Comment: currently pregnant   Other Topics Concern  . Not on file   Social History Narrative  . No narrative on file    Allergies:  Allergies  Allergen Reactions  . Penicillins Hives    Medications: Prior to Admission medications   Medication Sig Start Date End Date Taking? Authorizing Provider  Prenatal Vit-Fe Fumarate-FA (PRENATAL VITAMIN PO) Take by mouth.   Yes [provider]    Physical Exam Vitals: Blood pressure 114/70, weight 108 lb (49 kg), last menstrual period 10/30/2016, unknown if currently breastfeeding.  General: NAD HEENT: normocephalic, anicteric Thyroid: no enlargement, no palpable nodules Pulmonary: No increased work of breathing, CTAB Cardiovascular: RRR, distal pulses 2+ Abdomen: NABS, soft, non-tender, non-distended.  Umbilicus without lesions.  No hepatomegaly, splenomegaly or masses palpable. No evidence of hernia  Genitourinary:  External: Normal external female genitalia.  Normal urethral meatus, normal  Bartholin's and Skene's glands.    Vagina: Normal vaginal mucosa, no evidence of prolapse.    Cervix: Grossly normal in appearance, no bleeding, no CMT  Uterus: Enlarged, mobile, normal contour.    Adnexa: ovaries non-enlarged, no adnexal masses  Rectal: deferred Extremities: no edema, erythema, or tenderness Neurologic: Grossly intact Psychiatric: mood appropriate, affect full   Assessment: 21 y.o. G3P2002 at Unknown presenting to initiate prenatal care  Plan: 1) Avoid alcoholic beverages. 2) Patient encouraged not to smoke.  3) Discontinue the use of all non-medicinal drugs and chemicals.  4) Take prenatal vitamins daily.  5) Nutrition, food safety (fish, cheese advisories, and high nitrite foods) and exercise discussed. 6) Hospital and practice style discussed with cross coverage system.  7) Genetic Screening, such as with 1st Trimester Screening, cell free fetal DNA, AFP testing, and Ultrasound, as well as with  amniocentesis and CVS as appropriate, is discussed with patient. At the conclusion of today's visit patient requested genetic testing 8) Patient is asked about travel to areas at risk for the Zika virus, and counseled to avoid travel and exposure to mosquitoes or sexual partners who may have themselves been exposed to the virus. Testing is discussed, and will be ordered as appropriate.   Tresea Mall, CNM

## 2017-01-10 NOTE — Patient Instructions (Signed)

## 2017-01-10 NOTE — Progress Notes (Signed)
NOB today.  

## 2017-01-12 LAB — RPR+RH+ABO+RUB AB+AB SCR+CB...
Antibody Screen: NEGATIVE
HEMATOCRIT: 33.5 % — AB (ref 34.0–46.6)
HEP B S AG: NEGATIVE
HIV SCREEN 4TH GENERATION: NONREACTIVE
Hemoglobin: 10.5 g/dL — ABNORMAL LOW (ref 11.1–15.9)
MCH: 26.9 pg (ref 26.6–33.0)
MCHC: 31.3 g/dL — ABNORMAL LOW (ref 31.5–35.7)
MCV: 86 fL (ref 79–97)
Platelets: 276 10*3/uL (ref 150–379)
RBC: 3.9 x10E6/uL (ref 3.77–5.28)
RDW: 14.1 % (ref 12.3–15.4)
RH TYPE: POSITIVE
RPR: NONREACTIVE
Rubella Antibodies, IGG: 26.3 index (ref >0.99)
VARICELLA: 3790 {index} (ref >165)
WBC: 7.5 10*3/uL (ref 3.4–10.8)

## 2017-01-15 LAB — GC/CHLAMYDIA PROBE AMP
Chlamydia trachomatis, NAA: NEGATIVE
NEISSERIA GONORRHOEAE BY PCR: NEGATIVE

## 2017-01-15 LAB — URINE CULTURE

## 2017-01-20 ENCOUNTER — Encounter: Payer: Self-pay | Admitting: Maternal Newborn

## 2017-01-20 ENCOUNTER — Other Ambulatory Visit: Payer: Self-pay | Admitting: Advanced Practice Midwife

## 2017-01-20 ENCOUNTER — Ambulatory Visit (INDEPENDENT_AMBULATORY_CARE_PROVIDER_SITE_OTHER): Payer: Medicaid Other

## 2017-01-20 ENCOUNTER — Ambulatory Visit (INDEPENDENT_AMBULATORY_CARE_PROVIDER_SITE_OTHER): Payer: Medicaid Other | Admitting: Maternal Newborn

## 2017-01-20 VITALS — BP 104/60 | Wt 106.0 lb

## 2017-01-20 DIAGNOSIS — Z362 Encounter for other antenatal screening follow-up: Secondary | ICD-10-CM | POA: Diagnosis not present

## 2017-01-20 DIAGNOSIS — Z3480 Encounter for supervision of other normal pregnancy, unspecified trimester: Secondary | ICD-10-CM

## 2017-01-20 DIAGNOSIS — O219 Vomiting of pregnancy, unspecified: Secondary | ICD-10-CM

## 2017-01-20 MED ORDER — ONDANSETRON 4 MG PO TBDP: 4.0000 mg | ORAL_TABLET | Freq: Four times a day (QID) | ORAL | 0 refills | Status: DC | PRN

## 2017-01-20 NOTE — Progress Notes (Signed)
Routine Prenatal Care Visit  Subjective  [name] SorensonSandra [name] is a 21 y.o. G3P2002 at 3442w4d being seen today for ongoing prenatal care.  She is currently monitored for the following issues for this low-risk pregnancy and has History of gestational hypertension; Supervision of other normal pregnancy, antepartum; and Nausea and vomiting during pregnancy on her problem list.  ----------------------------------------------------------------------------------- Patient reports nausea and vomiting.   Vag. Bleeding: None.  Denies leaking of fluid.  ----------------------------------------------------------------------------------- [name], current medications, past family history, past medical history, past social history, past surgical history and problem list. Problem list updated.   Objective  Blood pressure 104/60, weight 106 lb (48.1 kg), last menstrual period 10/30/2016, unknown if currently breastfeeding. Pregravid weight 103 lb (46.7 kg) Total Weight Gain 3 lb (1.361 kg) Urinalysis: Urine Protein: Trace Urine Glucose: Negative  Fetal Status: Fetal Heart Rate (bpm): Present         General:  Alert, oriented and cooperative. Patient is in no acute distress.  Skin: Skin is warm and dry. No rash noted.   Cardiovascular: Normal heart rate noted  Respiratory: Normal respiratory effort, no problems with respiration noted  Abdomen: Soft, gravid, appropriate for gestational age.       Pelvic:  Cervical exam deferred        Extremities: Normal range of motion.     Mental Status: Normal mood and affect. Normal behavior. Normal judgment and thought content.     Assessment   21 y.o. [id number] at 2642w4d, EDD 09/04/2017 by Ultrasound presenting for routine prenatal visit.  Plan   pregnancy Problems (from 01/10/17 to present)    Problem Noted Resolved   Nausea and vomiting during pregnancy 01/20/2017 by Oswaldo ConroySchmid,  Bronte Sabado Y, CNM No   Supervision of other normal pregnancy, antepartum 01/10/2017 by Tresea MallGledhill, Jane, CNM No   Overview Signed 01/10/2017 11:56 AM by Tresea MallGledhill, Jane, CNM    Clinic [facility] Prenatal Labs  Dating  Blood type: --/--/A POS (12/22 2229)   Genetic Screen 1 Screen:    AFP:     Quad:     NIPS: Antibody:NEG (12/22 2229)  Anatomic US  Rubella:   Varicella: @VZVIGG @  GTT Early:               Third trimester:  RPR: Non Reactive (12/22 2229)   Rhogam  HBsAg:     TDaP vaccine                       Flu Shot: HIV:     Baby Food                                GBS:   Contraception  Pap:  CBB     CS/VBAC NA   Support Person Partner: Mitchell          History of gestational hypertension 11/28/2011 by Lavera GuiseKrebsbach, Mary, CNM No   Overview Addendum 11/29/2011  5:13 PM by Lavera GuiseKrebsbach, Mary, CNM    24 hour protein 2032 on magnesium 24 hours pp       [facility]. Single IUP with GA 7w 4d, FHR 145 bpm. Explained that NT will be done nv at appropriate GA.   Patient has daily nausea and vomiting and is unable to tolerate Bonjesta due to extreme drowsiness. Benefits  and risks of Zofran reviewed, and patient desires Rx.   Preterm labor symptoms and general obstetric precautions including but not limited to vaginal bleeding, contractions, leaking of fluid and fetal movement were reviewed in detail with the patient.  Return in about 4 weeks (around 02/17/2017) for ROB/NT scan.  Marcelyn Bruins, CNM 01/20/2017  12:17 PM

## 2017-02-04 ENCOUNTER — Telehealth: Payer: Self-pay

## 2017-02-04 NOTE — Telephone Encounter (Signed)
Pt c/o rx that was written for nausea med was only for four days.  Would like a longer refill.  [phone]

## 2017-02-07 ENCOUNTER — Other Ambulatory Visit: Payer: Self-pay | Admitting: Maternal Newborn

## 2017-02-07 DIAGNOSIS — O219 Vomiting of pregnancy, unspecified: Secondary | ICD-10-CM

## 2017-02-07 MED ORDER — ONDANSETRON 4 MG PO TBDP: 4.0000 mg | ORAL_TABLET | Freq: Four times a day (QID) | ORAL | 0 refills | Status: AC | PRN

## 2017-02-15 ENCOUNTER — Ambulatory Visit (INDEPENDENT_AMBULATORY_CARE_PROVIDER_SITE_OTHER): Payer: Medicaid Other | Admitting: Maternal Newborn

## 2017-02-15 ENCOUNTER — Other Ambulatory Visit: Payer: Self-pay | Admitting: Maternal Newborn

## 2017-02-15 ENCOUNTER — Encounter: Payer: Self-pay | Admitting: Maternal Newborn

## 2017-02-15 ENCOUNTER — Ambulatory Visit (INDEPENDENT_AMBULATORY_CARE_PROVIDER_SITE_OTHER): Payer: Medicaid Other

## 2017-02-15 VITALS — BP 100/60 | Wt 110.0 lb

## 2017-02-15 DIAGNOSIS — Z3682 Encounter for antenatal screening for nuchal translucency: Secondary | ICD-10-CM

## 2017-02-15 DIAGNOSIS — Z3480 Encounter for supervision of other normal pregnancy, unspecified trimester: Secondary | ICD-10-CM

## 2017-02-15 DIAGNOSIS — N6311 Unspecified lump in the right breast, upper outer quadrant: Secondary | ICD-10-CM

## 2017-02-15 NOTE — Progress Notes (Signed)
C/o nausea, lump right breast - had it c last preg but this time it's really large. rj

## 2017-02-15 NOTE — Progress Notes (Signed)
Routine Prenatal Care Visit  Subjective  [name] SorensonSandra [name] is a 21 Charleso. G3P2002 at 5942w2d being seen today for ongoing prenatal care.  She is currently monitored for the following issues for this low-risk pregnancy and has History of gestational hypertension; Supervision of other normal pregnancy, antepartum; and Nausea and vomiting during pregnancy on her problem list.  ----------------------------------------------------------------------------------- Patient reports a new lump in her right breast that is painful and seems to be getting larger. She also thinks there may be some slight discharge from this breast. Vag. Bleeding: None. Denies leaking of fluid.  ----------------------------------------------------------------------------------- [name], current medications, past family history, past medical history, past social history, past surgical history and problem list. Problem list updated.  Objective  Blood pressure 100/60, weight 110 lb (49.9 kg), last menstrual period 10/30/2016, unknown if currently breastfeeding. Pregravid weight 103 lb (46.7 kg) Total Weight Gain 7 lb (3.175 kg) Urinalysis: Urine Protein: Negative Urine Glucose: Negative   General:  Alert, oriented and cooperative. Patient is in no acute distress.  Skin: Skin is warm and dry. No rash noted.   Cardiovascular: Normal heart rate noted  Respiratory: Normal respiratory effort, no problems with respiration noted  Abdomen: Soft, gravid, appropriate for gestational age. Pain/Pressure: Absent     Pelvic:  Cervical exam deferred        Extremities: Normal range of motion.     Mental Status: Normal mood and affect. Normal behavior. Normal judgment and thought content.   Breast: Palpable lump and area of tenderness on right breast at approximately 10 o'clock location. Left breast has no masses, tenderness, or discharge.  Assessment     21 Charleso. [id number] at 6142w2d, EDD 09/04/2017, by Ultrasound presenting for routine prenatal visit.  Plan   pregnancy Problems (from 01/10/17 to present)    Problem Noted Resolved   Nausea and vomiting during pregnancy 01/20/2017 by Oswaldo ConroySchmid, [name] Y, CNM No   Supervision of other normal pregnancy, antepartum 01/10/2017 by Tresea MallGledhill, [name], CNM No   Overview Signed 01/10/2017 11:56 AM by Tresea MallGledhill, [name], CNM    Clinic [facility] Prenatal Labs  Dating  Blood type: --/--/A POS (12/22 2229)   Genetic Screen 1 Screen:    AFP:     Quad:     NIPS: Antibody:NEG (12/22 2229)  Anatomic US  Rubella:   Varicella: @VZVIGG @  GTT Early:               Third trimester:  RPR: Non Reactive (12/22 2229)   Rhogam  HBsAg:     TDaP vaccine                       Flu Shot: HIV:     Baby Food                                GBS:   Contraception  Pap:  CBB     CS/VBAC NA   Support Person Partner: [name] [name]          History of gestational hypertension 11/28/2011 by Lavera GuiseKrebsbach, Mary, CNM No   Overview Addendum 11/29/2011  5:13 PM by Lavera GuiseKrebsbach, Mary, CNM    24 hour protein 2032 on magnesium 24 hours pp       Unable to complete first trimester screen because NT not visible today/ CRL was not large enough. Rescheduled for 2 weeks.  Palpable,  painful lump on right breast at approximately 10 o'clock. Patient is worried due to family history of breast cancer. She has had fibrocystic breast changes before. Breast ultrasound ordered today.  Preterm labor symptoms and general obstetric precautions were reviewed in detail with the patient.  Return in about 2 weeks (around 03/01/2017) for ROB with repeat NT.  Marcelyn BruinsJacelyn Schmid, CNM 02/15/2017  1:19 PM

## 2017-02-23 ENCOUNTER — Ambulatory Visit
Admission: RE | Admit: 2017-02-23 | Discharge: 2017-02-23 | Disposition: A | Discharge status: Home / Self Care | Payer: Medicaid Other | Source: Ambulatory Visit | Attending: Maternal Newborn | Admitting: Maternal Newborn

## 2017-02-23 DIAGNOSIS — N6311 Unspecified lump in the right breast, upper outer quadrant: Secondary | ICD-10-CM | POA: Insufficient documentation

## 2017-02-23 HISTORY — DX: Unspecified lump in unspecified breast: N63.0

## 2017-03-01 ENCOUNTER — Ambulatory Visit (INDEPENDENT_AMBULATORY_CARE_PROVIDER_SITE_OTHER): Payer: Medicaid Other

## 2017-03-01 ENCOUNTER — Encounter: Payer: Medicaid Other | Admitting: Advanced Practice Midwife

## 2017-03-01 ENCOUNTER — Ambulatory Visit (INDEPENDENT_AMBULATORY_CARE_PROVIDER_SITE_OTHER): Payer: Medicaid Other | Admitting: Certified Nurse Midwife

## 2017-03-01 ENCOUNTER — Encounter: Payer: Self-pay | Admitting: Certified Nurse Midwife

## 2017-03-01 VITALS — BP 98/58 | Wt 108.0 lb

## 2017-03-01 DIAGNOSIS — Z3A13 13 weeks gestation of pregnancy: Secondary | ICD-10-CM

## 2017-03-01 DIAGNOSIS — Z3480 Encounter for supervision of other normal pregnancy, unspecified trimester: Secondary | ICD-10-CM

## 2017-03-01 DIAGNOSIS — Z362 Encounter for other antenatal screening follow-up: Secondary | ICD-10-CM

## 2017-03-01 DIAGNOSIS — Z368A Encounter for antenatal screening for other genetic defects: Secondary | ICD-10-CM

## 2017-03-01 NOTE — Progress Notes (Signed)
ROB at 13wk2d: Has had an upper respiratory infection which is now resolving and she is eating a little better. Lost 2# in last 2 weeks.  CRL 13wk6d, nasal bone present, NT 1.688mm. First trimester lab drawn RTO 3 weeks and prn Farrel Connersolleen Jojo Pehl, CNM

## 2017-03-01 NOTE — Progress Notes (Signed)
Pt reports nausea/vomiting improved. NT scan today.

## 2017-03-03 LAB — FIRST TRIMESTER SCREEN W/NT
CRL: 76.6 mm
DIA MoM: 1.02
DIA VALUE: 277.4 pg/mL
Gest Age-Collect: 13.3 weeks
HCG MOM: 1
HCG VALUE: 98.1 [IU]/mL
Maternal Age At EDD: 22.9 yr
NUCHAL TRANSLUCENCY MOM: 0.96
Nuchal Translucency: 1.8 mm
Number of Fetuses: 1
PAPP-A MOM: 0.92
PAPP-A VALUE: 1755.1 ng/mL
Test Results:: NEGATIVE
WEIGHT: 108 [lb_av]

## 2017-03-24 ENCOUNTER — Encounter: Payer: Self-pay | Admitting: Obstetrics and Gynecology

## 2017-03-24 ENCOUNTER — Ambulatory Visit (INDEPENDENT_AMBULATORY_CARE_PROVIDER_SITE_OTHER): Payer: Medicaid Other | Admitting: Obstetrics and Gynecology

## 2017-03-24 VITALS — BP 100/64 | Wt 112.0 lb

## 2017-03-24 DIAGNOSIS — O09299 Supervision of pregnancy with other poor reproductive or obstetric history, unspecified trimester: Secondary | ICD-10-CM

## 2017-03-24 DIAGNOSIS — D649 Anemia, unspecified: Secondary | ICD-10-CM

## 2017-03-24 DIAGNOSIS — Z3A16 16 weeks gestation of pregnancy: Secondary | ICD-10-CM

## 2017-03-24 DIAGNOSIS — O0992 Supervision of high risk pregnancy, unspecified, second trimester: Secondary | ICD-10-CM

## 2017-03-24 DIAGNOSIS — K5900 Constipation, unspecified: Secondary | ICD-10-CM

## 2017-03-24 MED ORDER — DOCUSATE SODIUM 100 MG PO CAPS: 100.0000 mg | ORAL_CAPSULE | Freq: Two times a day (BID) | ORAL | 2 refills | Status: DC | PRN

## 2017-03-24 MED ORDER — ASPIRIN EC 81 MG PO TBEC: 81.0000 mg | DELAYED_RELEASE_TABLET | Freq: Every day | ORAL | 2 refills | Status: DC

## 2017-03-24 MED ORDER — FERROUS SULFATE 325 (65 FE) MG PO TABS: 325.0000 mg | ORAL_TABLET | Freq: Two times a day (BID) | ORAL | 1 refills | Status: DC

## 2017-03-24 NOTE — Progress Notes (Signed)
Routine Prenatal Care Visit  Subjective  [name] [name] is a 21 y.o. G3P2002 at 8736w4d being seen today for ongoing prenatal care.  She is currently monitored for the following issues for this high-risk pregnancy and has History of gestational hypertension; Pregnancy, supervision, high-risk; and Nausea and vomiting during pregnancy on their problem list.  ----------------------------------------------------------------------------------- Patient reports no complaints.   Contractions: Not present. Vag. Bleeding: None.   . Denies leaking of fluid.  ----------------------------------------------------------------------------------- [name], current medications, past family history, past medical history, past social history, past surgical history and problem list. Problem list updated.   Objective  Blood pressure 100/64, weight 112 lb (50.8 kg), last menstrual period 10/30/2016, unknown if currently breastfeeding. Pregravid weight 103 lb (46.7 kg) Total Weight Gain 9 lb (4.082 kg) Urinalysis: Urine Protein: Negative Urine Glucose: Negative  Fetal Status: Fetal Heart Rate (bpm): 145         General:  Alert, oriented and cooperative. Patient is in no acute distress.  Skin: Skin is warm and dry. No rash noted.   Cardiovascular: Normal heart rate noted  Respiratory: Normal respiratory effort, no problems with respiration noted  Abdomen: Soft, gravid, appropriate for gestational age. Pain/Pressure: Absent     Pelvic:  Cervical exam deferred        Extremities: Normal range of motion.  Edema: None  ental Status: Normal mood and affect. Normal behavior. Normal judgment and thought content.     Assessment   21 y.o. [id number] at 3636w4d by  09/04/2017, by Ultrasound presenting for routine prenatal visit  Plan   pregnancy Problems (from 01/10/17 to present)    Problem Noted Resolved   Nausea and vomiting  during pregnancy 01/20/2017 by Oswaldo ConroySchmid, Jacelyn Y, CNM No   Pregnancy, supervision, high-risk 01/10/2017 by Tresea MallGledhill, Jane, CNM No   Overview Addendum 03/24/2017 12:18 PM by Natale MilchSchuman, Keyonia Gluth R, MD    Clinic [facility] Prenatal Labs  Dating  Blood type: A/Positive/-- (10/15 1153)   Genetic Screen 1 Screen: Normal     AFP:      Antibody:Negative (10/15 1153)  Anatomic US  Rubella: 26.30 (10/15 1153) Varicella: Immune  GTT Early:               Third trimester:  RPR: Non Reactive (10/15 1153)   Rhogam  Not applicable HBsAg: Negative (10/15 1153)   TDaP vaccine                       Flu Shot: HIV: Non Reactive (10/15 1153)   Baby Food                                GBS:   Contraception  Considering tubal vs copper IUD Pap:  CBB     CS/VBAC NA   Support Person Partner: Mitchell          History of gestational hypertension 11/28/2011 by Lavera GuiseKrebsbach, Mary, CNM No   Overview Addendum 03/01/2017  9:36 PM by Farrel ConnersGutierrez, Colleen, CNM    24 hour protein 2032 on magnesium 24 hours pp With first pregnancy           Baseline CMP and Pr:CR ratio ordered today. Patient does report her first pregancy was with a different father. Second pregnancy and current pregnancy with same father.  Discussed starting 81mg  Aspirin, patient will initiate therapy.  AFP today  Could not perform flu vaccination because office out of supply today, will need at next visit. Constipation- patient taking colace, needs refill Anemia- patient aware, but iron constipates more, taking prenatal with Iron. Supplemental Iron prescription sent.   Preterm labor symptoms and general obstetric precautions including but not limited to vaginal bleeding, contractions, leaking of fluid and fetal movement were reviewed in detail with the patient. Please refer to After Visit Summary for other counseling recommendations.   Return in about 4 weeks (around 04/21/2017) for ROB.

## 2017-03-25 LAB — COMPREHENSIVE METABOLIC PANEL
ALBUMIN: 3.7 g/dL (ref 3.5–5.5)
ALK PHOS: 52 IU/L (ref 39–117)
ALT: 6 IU/L (ref 0–32)
AST: 9 IU/L (ref 0–40)
Albumin/Globulin Ratio: 1.4 (ref 1.2–2.2)
BUN / CREAT RATIO: 19 (ref 9–23)
BUN: 12 mg/dL (ref 6–20)
CHLORIDE: 104 mmol/L (ref 96–106)
CO2: 19 mmol/L — ABNORMAL LOW (ref 20–29)
Calcium: 9.3 mg/dL (ref 8.7–10.2)
Creatinine, Ser: 0.64 mg/dL (ref 0.57–1.00)
GFR calc Af Amer: 146 mL/min/{1.73_m2} (ref >59)
GFR calc non Af Amer: 127 mL/min/{1.73_m2} (ref >59)
GLOBULIN, TOTAL: 2.7 g/dL (ref 1.5–4.5)
GLUCOSE: 69 mg/dL (ref 65–99)
POTASSIUM: 4 mmol/L (ref 3.5–5.2)
SODIUM: 140 mmol/L (ref 134–144)
Total Protein: 6.4 g/dL (ref 6.0–8.5)

## 2017-03-25 LAB — PROTEIN / CREATININE RATIO, URINE
CREATININE, UR: 151.3 mg/dL
Protein, Ur: 14.9 mg/dL
Protein/Creat Ratio: 98 mg/g creat (ref 0–200)

## 2017-03-27 LAB — AFP, SERUM, OPEN SPINA BIFIDA
AFP MOM: 1.33
AFP VALUE AFPOSL: 56.5 ng/mL
Gest. Age on Collection Date: 16.6 weeks
Maternal Age At EDD: 22.8 yr
OSBR RISK 1 IN: 4399
Test Results:: NEGATIVE
WEIGHT: 112 [lb_av]

## 2017-03-29 NOTE — L&D Delivery Note (Signed)
Delivery Note At 4:51 PM a viable female was delivered via Vaginal, Spontaneous (Presentation: ROA).  APGAR: 8, 9; weight 7 lb 3 oz (3260 g).   Placenta status:spontanous and intact Cord: 3VC, nuchal x 2 reduced on perineum without complications.  Anesthesia:  Epidural Episiotomy: none Lacerations: none   Suture Repair: N/A Est. Blood Loss (mL):   Mom to postpartum.  Baby to Couplet care / Skin to Skin.  Vena Austria 08/25/2017, 5:08 PM

## 2017-03-30 NOTE — Progress Notes (Signed)
Called and left message that results were normal.

## 2017-04-21 ENCOUNTER — Encounter: Payer: Medicaid Other | Admitting: Obstetrics & Gynecology

## 2017-04-21 ENCOUNTER — Other Ambulatory Visit: Payer: Medicaid Other

## 2017-04-22 ENCOUNTER — Ambulatory Visit (INDEPENDENT_AMBULATORY_CARE_PROVIDER_SITE_OTHER): Payer: Medicaid Other

## 2017-04-22 DIAGNOSIS — O0992 Supervision of high risk pregnancy, unspecified, second trimester: Secondary | ICD-10-CM | POA: Diagnosis not present

## 2017-04-22 DIAGNOSIS — Z362 Encounter for other antenatal screening follow-up: Secondary | ICD-10-CM

## 2017-04-22 DIAGNOSIS — Z3A21 21 weeks gestation of pregnancy: Secondary | ICD-10-CM

## 2017-04-25 ENCOUNTER — Other Ambulatory Visit: Payer: Medicaid Other

## 2017-04-25 ENCOUNTER — Encounter: Payer: Medicaid Other | Admitting: Obstetrics and Gynecology

## 2017-04-26 ENCOUNTER — Ambulatory Visit (INDEPENDENT_AMBULATORY_CARE_PROVIDER_SITE_OTHER): Payer: Medicaid Other | Admitting: Obstetrics & Gynecology

## 2017-04-26 VITALS — BP 120/60 | Wt 121.0 lb

## 2017-04-26 DIAGNOSIS — Z23 Encounter for immunization: Secondary | ICD-10-CM

## 2017-04-26 DIAGNOSIS — O0992 Supervision of high risk pregnancy, unspecified, second trimester: Secondary | ICD-10-CM

## 2017-04-26 DIAGNOSIS — Z8759 Personal history of other complications of pregnancy, childbirth and the puerperium: Secondary | ICD-10-CM

## 2017-04-26 DIAGNOSIS — Z3A21 21 weeks gestation of pregnancy: Secondary | ICD-10-CM

## 2017-04-26 MED ORDER — BUTALBITAL-APAP-CAFFEINE 50-325-40 MG PO CAPS: 1.0000 | ORAL_CAPSULE | Freq: Four times a day (QID) | ORAL | 3 refills | Status: DC | PRN

## 2017-04-26 NOTE — Addendum Note (Signed)
Addended by: Nadara MustardHARRIS, Wayden Schwertner P on: 04/26/2017 03:21 PM   Modules accepted: Orders

## 2017-04-26 NOTE — Addendum Note (Signed)
Addended by: Cornelius MorasPATTERSON, Sydelle Sherfield D on: 04/26/2017 03:27 PM   Modules accepted: Orders

## 2017-04-26 NOTE — Progress Notes (Signed)
Review of ULTRASOUND.    [facility] personally [facility] images and [facility]    Plan of management to be discussed with patient. Flu shot today  Annamarie MajorPaul Madysen Faircloth, MD, Merlinda FrederickFACOG [facility], [facility] 04/26/2017  3:16 PM

## 2017-04-26 NOTE — Patient Instructions (Signed)

## 2017-04-27 ENCOUNTER — Encounter: Payer: Medicaid Other | Admitting: Obstetrics & Gynecology

## 2017-05-23 ENCOUNTER — Ambulatory Visit (INDEPENDENT_AMBULATORY_CARE_PROVIDER_SITE_OTHER): Payer: Medicaid Other | Admitting: Obstetrics and Gynecology

## 2017-05-23 VITALS — BP 100/56 | Wt 129.0 lb

## 2017-05-23 DIAGNOSIS — Z3A25 25 weeks gestation of pregnancy: Secondary | ICD-10-CM

## 2017-05-23 DIAGNOSIS — Z113 Encounter for screening for infections with a predominantly sexual mode of transmission: Secondary | ICD-10-CM

## 2017-05-23 DIAGNOSIS — Z8759 Personal history of other complications of pregnancy, childbirth and the puerperium: Secondary | ICD-10-CM

## 2017-05-23 DIAGNOSIS — O0992 Supervision of high risk pregnancy, unspecified, second trimester: Secondary | ICD-10-CM

## 2017-05-23 DIAGNOSIS — Z131 Encounter for screening for diabetes mellitus: Secondary | ICD-10-CM

## 2017-05-23 MED ORDER — PROCHLORPERAZINE MALEATE 10 MG PO TABS: 10.0000 mg | ORAL_TABLET | Freq: Four times a day (QID) | ORAL | 3 refills | Status: DC | PRN

## 2017-05-23 NOTE — Progress Notes (Signed)
ROB

## 2017-05-23 NOTE — Progress Notes (Signed)
Routine Prenatal Care Visit  Subjective  [name] SorensonSandra Kusek is a 22 y.o. G3P2002 at 8151w1d being seen today for ongoing prenatal care.  She is currently monitored for the following issues for this low-risk pregnancy and has History of gestational hypertension; Pregnancy, supervision, high-risk; and Nausea and vomiting during pregnancy on their problem list.  ----------------------------------------------------------------------------------- Patient reports headache.   Contractions: Not present. Vag. Bleeding: None.  Movement: Present. Denies leaking of fluid.  ----------------------------------------------------------------------------------- [name], current medications, past family history, past medical history, past social history, past surgical history and problem list. Problem list updated.   Objective  Blood pressure (!) 100/56, weight 129 lb (58.5 kg), last menstrual period 10/30/2016, unknown if currently breastfeeding. Pregravid weight 103 lb (46.7 kg) Total Weight Gain 26 lb (11.8 kg) Urinalysis: Urine Protein: Negative Urine Glucose: Negative  Fetal Status: Fetal Heart Rate (bpm): 150 Fundal Height: 24 cm Movement: Present     General:  Alert, oriented and cooperative. Patient is in no acute distress.  Skin: Skin is warm and dry. No rash noted.   Cardiovascular: Normal heart rate noted  Respiratory: Normal respiratory effort, no problems with respiration noted  Abdomen: Soft, gravid, appropriate for gestational age. Pain/Pressure: Absent     Pelvic:  Cervical exam deferred        Extremities: Normal range of motion.     ental Status: Normal mood and affect. Normal behavior. Normal judgment and thought content.   Immunization History  Administered Date(s) Administered  . Influenza,inj,Quad PF,6+ Mos 04/26/2017     Assessment   21 y.o. [id number] at 10851w1d by  09/04/2017, by Ultrasound  presenting for routine prenatal visit  Plan   pregnancy Problems (from 01/10/17 to present)    Problem Noted Resolved   Nausea and vomiting during pregnancy 01/20/2017 by Oswaldo ConroySchmid, Jacelyn Y, CNM No   Pregnancy, supervision, high-risk 01/10/2017 by Tresea MallGledhill, Jane, CNM No   Overview Addendum 05/23/2017 10:51 AM by Vena AustriaStaebler, Quante Pettry, MD    [facility]inic [facility] Prenatal Labs  Dating 7 week US Blood type: A/Positive/-- (10/15 1153)   Genetic Screen 1 Screen: Normal  AFP negative    Antibody:Negative (10/15 1153)  Anatomic US Normal Female Rubella: 26.30 (10/15 1153) Varicella: Immune  GTT  RPR: Non Reactive (10/15 1153)   Rhogam  Not applicable HBsAg: Negative (10/15 1153)   TDaP vaccine                       Flu Shot: HIV: Non Reactive (10/15 1153)   Baby Food                                GBS:   Contraception  Considering tubal vs copper IUD Pap: 05/19/2016 ASCUS HPV positive  CBB     CS/VBAC NA   Support Person Partner: Clovis RileyMitchell          History of gestational hypertension 11/28/2011 by Lavera GuiseKrebsbach, Mary, CNM No   Overview Addendum 03/01/2017  9:36 PM by Farrel ConnersGutierrez, Colleen, CNM    24 hour protein 2032 on magnesium 24 hours pp With first pregnancy          Gestational age appropriate obstetric precautions including but not limited to vaginal bleeding, contractions, leaking of fluid and fetal movement were reviewed in detail with the patient.   - switch to compazine discontinue Fioricet  - discussed magnesium  supplementation for headaches  Return in about 3 weeks (around 06/13/2017) for ROB and 28 week labs.  Vena Austria, MD, Evern Core [facility], [facility] 05/23/2017, 11:07 AM

## 2017-06-13 ENCOUNTER — Ambulatory Visit (INDEPENDENT_AMBULATORY_CARE_PROVIDER_SITE_OTHER): Payer: Medicaid Other | Admitting: Obstetrics and Gynecology

## 2017-06-13 ENCOUNTER — Other Ambulatory Visit: Payer: Medicaid Other

## 2017-06-13 VITALS — BP 106/50 | Wt 132.0 lb

## 2017-06-13 DIAGNOSIS — G43709 Chronic migraine without aura, not intractable, without status migrainosus: Secondary | ICD-10-CM

## 2017-06-13 DIAGNOSIS — Z131 Encounter for screening for diabetes mellitus: Secondary | ICD-10-CM

## 2017-06-13 DIAGNOSIS — Z8759 Personal history of other complications of pregnancy, childbirth and the puerperium: Secondary | ICD-10-CM

## 2017-06-13 DIAGNOSIS — O219 Vomiting of pregnancy, unspecified: Secondary | ICD-10-CM

## 2017-06-13 DIAGNOSIS — O0992 Supervision of high risk pregnancy, unspecified, second trimester: Secondary | ICD-10-CM

## 2017-06-13 DIAGNOSIS — Z3A28 28 weeks gestation of pregnancy: Secondary | ICD-10-CM

## 2017-06-13 DIAGNOSIS — G43909 Migraine, unspecified, not intractable, without status migrainosus: Secondary | ICD-10-CM | POA: Insufficient documentation

## 2017-06-13 DIAGNOSIS — Z113 Encounter for screening for infections with a predominantly sexual mode of transmission: Secondary | ICD-10-CM

## 2017-06-13 NOTE — Progress Notes (Signed)
Routine Prenatal Care Visit  Subjective  [name] SorensonSandra [name] is a 22 y.o. G3P2002 at 2476w1d being seen today for ongoing prenatal care.  She is currently monitored for the following issues for this high-risk pregnancy and has History of gestational hypertension; Pregnancy, supervision, high-risk; Nausea and vomiting during pregnancy; and Migraine on their problem list.  ----------------------------------------------------------------------------------- Patient reports headache.  Patient is taking compazine and magnessium without improvement. Contractions: Not present. Vag. Bleeding: None.  Movement: Present. Denies leaking of fluid.  ----------------------------------------------------------------------------------- [name], current medications, past family history, past medical history, past social history, past surgical history and problem list. Problem list updated.   Objective  Blood pressure (!) 106/50, weight 132 lb (59.9 kg), last menstrual period 10/30/2016, unknown if currently breastfeeding. Pregravid weight 103 lb (46.7 kg) Total Weight Gain 29 lb (13.2 kg) Urinalysis: Urine Protein: Negative Urine Glucose: Negative  Fetal Status: Fetal Heart Rate (bpm): 135 Fundal Height: 28 cm Movement: Present     General:  Alert, oriented and cooperative. Patient is in no acute distress.  Skin: Skin is warm and dry. No rash noted.   Cardiovascular: Normal heart rate noted  Respiratory: Normal respiratory effort, no problems with respiration noted  Abdomen: Soft, gravid, appropriate for gestational age. Pain/Pressure: Absent     Pelvic:  Cervical exam deferred        Extremities: Normal range of motion.     ental Status: Normal mood and affect. Normal behavior. Normal judgment and thought content.     Assessment   22 y.o. [id number] at 7676w1d by  09/04/2017, by Ultrasound presenting for routine prenatal  visit  Plan   pregnancy Problems (from 01/10/17 to present)    Problem Noted Resolved   Nausea and vomiting during pregnancy 01/20/2017 by Oswaldo ConroySchmid, Jacelyn Y, CNM No   Pregnancy, supervision, high-risk 01/10/2017 by Tresea MallGledhill, Jane, CNM No   Overview Addendum 05/23/2017 10:52 AM by Vena AustriaStaebler, Andreas, MD    [facility]inic [facility] Prenatal Labs  Dating 7 week US Blood type: A/Positive/-- (10/15 1153)   Genetic Screen 1 Screen: Normal  AFP negative    Antibody:Negative (10/15 1153)  Anatomic US Normal Female Rubella: 26.30 (10/15 1153) Varicella: Immune  GTT  RPR: Non Reactive (10/15 1153)   Rhogam  Not applicable HBsAg: Negative (10/15 1153)   TDaP vaccine                        Flu Shot: 04/26/17 HIV: Non Reactive (10/15 1153)   Baby Food                                GBS:   Contraception  Considering tubal vs copper IUD Pap: 05/19/2016 ASCUS HPV positive  CBB     CS/VBAC NA   Support Person Partner: Clovis RileyMitchell          History of gestational hypertension 11/28/2011 by Lavera GuiseKrebsbach, Mary, CNM No   Overview Addendum 03/01/2017  9:36 PM by Farrel ConnersGutierrez, Colleen, CNM    24 hour protein 2032 on magnesium 24 hours pp With first pregnancy          Gestational age appropriate obstetric precautions including but not limited to vaginal bleeding, contractions, leaking of fluid and fetal movement were reviewed in detail with the patient.    Patient taking magnesium and compazine for headaches. Discussed adding Tylenol with 2 500mg   tablets every 8 hours. If this is not sufficient we can add Tylenol #3. Patient would like a tubal ligation, sign forms at next visit.  Return in about 2 weeks (around 06/27/2017) for ROB.  Richelle Ito [facility], [facility] 06/13/2017, 9:01 AM

## 2017-06-14 LAB — 28 WEEK RH+PANEL
BASOS ABS: 0 10*3/uL (ref 0.0–0.2)
Basos: 0 %
EOS (ABSOLUTE): 0.1 10*3/uL (ref 0.0–0.4)
EOS: 1 %
Gestational Diabetes Screen: 84 mg/dL (ref 65–139)
HIV SCREEN 4TH GENERATION: NONREACTIVE
Hematocrit: 27 % — ABNORMAL LOW (ref 34.0–46.6)
Hemoglobin: 8.6 g/dL — ABNORMAL LOW (ref 11.1–15.9)
IMMATURE GRANS (ABS): 0.1 10*3/uL (ref 0.0–0.1)
IMMATURE GRANULOCYTES: 1 %
LYMPHS ABS: 2.1 10*3/uL (ref 0.7–3.1)
LYMPHS: 20 %
MCH: 28.1 pg (ref 26.6–33.0)
MCHC: 31.9 g/dL (ref 31.5–35.7)
MCV: 88 fL (ref 79–97)
MONOCYTES: 7 %
Monocytes Absolute: 0.8 10*3/uL (ref 0.1–0.9)
Neutrophils Absolute: 7.3 10*3/uL — ABNORMAL HIGH (ref 1.4–7.0)
Neutrophils: 71 %
PLATELETS: 314 10*3/uL (ref 150–379)
RBC: 3.06 x10E6/uL — ABNORMAL LOW (ref 3.77–5.28)
RDW: 13.3 % (ref 12.3–15.4)
RPR Ser Ql: NONREACTIVE
WBC: 10.3 10*3/uL (ref 3.4–10.8)

## 2017-06-17 ENCOUNTER — Other Ambulatory Visit: Payer: Self-pay | Admitting: Obstetrics and Gynecology

## 2017-06-17 ENCOUNTER — Encounter: Payer: Self-pay | Admitting: Obstetrics and Gynecology

## 2017-06-17 DIAGNOSIS — O99019 Anemia complicating pregnancy, unspecified trimester: Secondary | ICD-10-CM | POA: Insufficient documentation

## 2017-06-21 ENCOUNTER — Inpatient Hospital Stay: Payer: Medicaid Other | Admitting: Oncology

## 2017-06-27 ENCOUNTER — Encounter: Payer: Self-pay | Admitting: Maternal Newborn

## 2017-06-27 ENCOUNTER — Ambulatory Visit (INDEPENDENT_AMBULATORY_CARE_PROVIDER_SITE_OTHER): Payer: Medicaid Other | Admitting: Maternal Newborn

## 2017-06-27 VITALS — BP 98/56 | Wt 132.0 lb

## 2017-06-27 DIAGNOSIS — Z23 Encounter for immunization: Secondary | ICD-10-CM

## 2017-06-27 DIAGNOSIS — O0993 Supervision of high risk pregnancy, unspecified, third trimester: Secondary | ICD-10-CM | POA: Diagnosis not present

## 2017-06-27 DIAGNOSIS — Z3A30 30 weeks gestation of pregnancy: Secondary | ICD-10-CM

## 2017-06-27 NOTE — Progress Notes (Signed)
Routine Prenatal Care Visit  Subjective  [name] [name] is a 22 y.o. G3P2002 at 4729w1d being seen today for ongoing prenatal care.  She is currently monitored for the following issues for this high-risk pregnancy and has History of gestational hypertension; Pregnancy, supervision, high-risk; Nausea and vomiting during pregnancy; Migraine; and Anemia of mother in pregnancy on their problem list.  ----------------------------------------------------------------------------------- Patient reports headaches are still intermittently present. Some relief with medications and rest. Does not want to try additional medications such as Fioricet at this time.   Contractions: Not present. Vag. Bleeding: None.  Movement: Present. Denies leaking of fluid.  ----------------------------------------------------------------------------------- [name], current medications, past family history, past medical history, past social history, past surgical history and problem list. Problem list updated.   Objective  Last menstrual period 10/30/2016, unknown if currently breastfeeding. Pregravid weight 103 lb (46.7 kg) Total Weight Gain 29 lb (13.2 kg) Urinalysis: Urine Protein: 1+ Urine Glucose: Negative  Fetal Status: Fetal Heart Rate (bpm): 140 Fundal Height: 30 cm Movement: Present     General:  Alert, oriented and cooperative. Patient is in no acute distress.  Skin: Skin is warm and dry. No rash noted.   Cardiovascular: Normal heart rate noted  Respiratory: Normal respiratory effort, no problems with respiration noted  Abdomen: Soft, gravid, appropriate for gestational age. Pain/Pressure: Absent     Pelvic:  Cervical exam deferred        Extremities: Normal range of motion.  Edema: None  Mental Status: Normal mood and affect. Normal behavior. Normal judgment and thought content.     Assessment   22 y.o. [id number] at  4029w1d, EDD 09/04/2017 by Ultrasound presenting for routine prenatal visit.  Plan   pregnancy Problems (from 01/10/17 to present)    Problem Noted Resolved   Anemia of mother in pregnancy 06/17/2017 by Vena AustriaStaebler, Andreas, MD No   Overview Signed 06/17/2017 12:37 PM by Vena AustriaStaebler, Andreas, MD    Hgb 8.6 at 28 week labs despite already being on po iron, hematology referral placed      Nausea and vomiting during pregnancy 01/20/2017 by Oswaldo ConroySchmid, Jacelyn Y, CNM No   Pregnancy, supervision, high-risk 01/10/2017 by Tresea MallGledhill, Jane, CNM No   Overview Addendum 06/13/2017  9:03 AM by Natale MilchSchuman, Christanna R, MD    Clinic [facility] Prenatal Labs  Dating 7 week US Blood type: A/Positive/-- (10/15 1153)   Genetic Screen 1 Screen: Normal  AFP negative    Antibody:Negative (10/15 1153)  Anatomic US Normal Female Rubella: 26.30 (10/15 1153) Varicella: Immune  GTT  RPR: Non Reactive (10/15 1153)   Rhogam  Not applicable HBsAg: Negative (10/15 1153)   TDaP vaccine                        Flu Shot: 04/26/17 HIV: Non Reactive (10/15 1153)   Baby Food                                GBS:   Contraception  Considering tubal vs copper IUD, [ ]  sign tubal consent Pap: 05/19/2016 ASCUS HPV positive  CBB     CS/VBAC NA   Support Person Partner: Clovis RileyMitchell          History of gestational hypertension 11/28/2011 by Lavera GuiseKrebsbach, Mary, CNM No   Overview Addendum 03/01/2017  9:36 PM by Farrel ConnersGutierrez, Colleen, CNM  24 hour protein 2032 on magnesium 24 hours pp With first pregnancy       TDaP today and BTL consent.  Preterm labor symptoms and general obstetric precautions were reviewed with the patient.  Return in about 2 weeks (around 07/11/2017) for ROB.  Marcelyn Bruins, CNM 06/27/2017  11:02 AM

## 2017-06-27 NOTE — Progress Notes (Signed)
Pt reports no problems. tdap and blood consent today. BTL consent today.

## 2017-06-29 NOTE — Progress Notes (Signed)
[facility]  [phone] [phone]  ID: Norberto SorensonSandra Lantier OB: 1995-04-03  MR#: [id number]  NGE#:[id number]#:[id number]  Patient Care Team: Patient, No Pcp Per as PCP - General (General Practice) Jim LikeLambert, Sheena M, RN as Registered Nurse Scarlett PrestoShaver, Anne F, RN as Registered Nurse  CHIEF COMPLAINT: Anemia in the third trimester of pregnancy.  INTERVAL HISTORY: Patient is a 22 year old female who was noted to have declining hemoglobin and iron stores on routine blood work for pregnancy.  Patient states this is her third pregnancy and has been anemic with her first 2.  She currently feels well and is asymptomatic.  She does not complain of weakness and fatigue.  She has a good appetite and gaining weight appropriately.  She has no neurologic complaints.  She denies any recent fevers or illnesses.  She has no chest pain or shortness of breath.  She denies any nausea, vomiting, constipation, or diarrhea.  She has no urinary complaints.  Patient offers no specific complaints today.  REVIEW OF SYSTEMS:   Review of Systems  Constitutional: Negative.  Negative for fever, malaise/fatigue and weight loss.  Respiratory: Negative.  Negative for cough and shortness of breath.   Cardiovascular: Negative.  Negative for chest pain and leg swelling.  Gastrointestinal: Negative.  Negative for abdominal pain, blood in stool and melena.  Genitourinary: Negative.  Negative for hematuria.  Skin: Negative.  Negative for rash.  Neurological: Negative.  Negative for sensory change, focal weakness and weakness.  Psychiatric/Behavioral: Negative.  The patient is not nervous/anxious.     As per HPI. Otherwise, a complete review of systems is negative.  PAST MEDICAL HISTORY: Past Medical History:  Diagnosis Date  . Anemia   . Breast mass   . H/O rubella   . H/O varicella   . Hx of ovarian cyst   . No pertinent past medical history   . Yeast infection     PAST SURGICAL HISTORY: Past  Surgical History:  Procedure Laterality Date  . AUGMENTATION MAMMAPLASTY    . NO PAST SURGERIES      FAMILY HISTORY: Family History  Problem Relation Age of Onset  . Alcohol abuse Mother   . Depression Mother   . Alcohol abuse Father   . Diabetes Maternal Grandmother   . Breast cancer Maternal Grandmother   . Mental retardation Cousin   . Autism Cousin   . Breast cancer Maternal Aunt     ADVANCED DIRECTIVES (Y/N):  N  HEALTH MAINTENANCE: Social History   Tobacco Use  . Smoking status: Never Smoker  . Smokeless tobacco: Never Used  Substance Use Topics  . Alcohol use: No  . Drug use: No     Colonoscopy:  PAP:  Bone density:  Lipid panel:  Allergies  Allergen Reactions  . Penicillins Hives    Current Outpatient Medications  Medication Sig Dispense Refill  . Butalbital-APAP-Caffeine 50-325-40 MG capsule Take 1-2 capsules by mouth every 6 (six) hours as needed for headache. (Patient not taking: Reported on 06/13/2017) 30 capsule 3  . docusate sodium (COLACE) 100 MG capsule Take 1 capsule (100 mg total) by mouth 2 (two) times daily as needed. (Patient not taking: Reported on 06/13/2017) 30 capsule 2  . ferrous sulfate (FERROUSUL) 325 (65 FE) MG tablet Take 1 tablet (325 mg total) by mouth 2 (two) times daily. (Patient not taking: Reported on 06/27/2017) 60 tablet 1  . magnesium 30 MG tablet Take 30 mg by mouth 2 (two) times daily.    . Prenatal  Vit-Fe Fumarate-FA (PRENATAL VITAMIN PO) Take by mouth.    . prochlorperazine (COMPAZINE) 10 MG tablet Take 10 mg by mouth every 6 (six) hours as needed for nausea or vomiting.     No current facility-administered medications for this visit.     OBJECTIVE: There were no vitals filed for this visit.   There is no height or weight on file to calculate BMI.    ECOG FS:0 - Asymptomatic  General: Well-developed, well-nourished, no acute distress. Eyes: Pink conjunctiva, anicteric sclera. HEENT: Normocephalic, moist mucous  membranes, clear oropharnyx. Lungs: Clear to auscultation bilaterally. Heart: Regular rate and rhythm. No rubs, murmurs, or gallops. Abdomen: Appears appropriate for gestational age. Musculoskeletal: No edema, cyanosis, or clubbing. Neuro: Alert, answering all questions appropriately. Cranial nerves grossly intact. Skin: No rashes or petechiae noted. Psych: Normal affect. Lymphatics: No cervical, calvicular, axillary or inguinal LAD.   LAB RESULTS:  Lab Results  Component Value Date   NA 140 03/24/2017   K 4.0 03/24/2017   CL 104 03/24/2017   CO2 19 (L) 03/24/2017   GLUCOSE 69 03/24/2017   BUN 12 03/24/2017   CREATININE 0.64 03/24/2017   CALCIUM 9.3 03/24/2017   PROT 6.4 03/24/2017   ALBUMIN 3.7 03/24/2017   AST 9 03/24/2017   ALT 6 03/24/2017   ALKPHOS 52 03/24/2017   BILITOT <0.2 03/24/2017   GFRNONAA 127 03/24/2017   GFRAA 146 03/24/2017    Lab Results  Component Value Date   WBC 10.3 06/13/2017   NEUTROABS 7.3 (H) 06/13/2017   HGB 8.6 (L) 06/13/2017   HCT 27.0 (L) 06/13/2017   MCV 88 06/13/2017   PLT 314 06/13/2017   Lab Results  Component Value Date   IRON 16 (L) 06/30/2017   TIBC 591 (H) 06/30/2017   IRONPCTSAT 3 (L) 06/30/2017   Lab Results  Component Value Date   FERRITIN 4 (L) 06/30/2017     STUDIES: No results found.  ASSESSMENT: Anemia in the third trimester of pregnancy.  PLAN:   1. Anemia in the third trimester of pregnancy: Patient states she takes a prenatal vitamin with iron.  Despite this, hemoglobin and iron stores have trended down.  She will benefit from IV Feraheme.  Return to clinic in 1 and 2 weeks for 510 mg IV Feraheme.  Patient will then return to clinic last week of May 1-2 weeks prior to date for repeat laboratory work and further evaluation. 2.  B12 deficiency: Patient's B12 level is 111.  She will also receive 1000 mg IM B12 at her next visit. 3.  Pregnancy: Patient reports her due date is approximately September 04, 2017.   Continue evaluation and monitoring per OB/GYN.  Approximately 45 minutes was spent in discussion and chart review of which greater than 50% was consultation.  Patient expressed understanding and was in agreement with this plan. She also understands that She can call clinic at any time with any questions, concerns, or complaints.    Jeralyn Ruths, MD   06/29/2017 12:48 AM

## 2017-06-30 ENCOUNTER — Encounter: Payer: Self-pay | Admitting: Oncology

## 2017-06-30 ENCOUNTER — Inpatient Hospital Stay: Payer: Medicaid Other | Attending: Oncology | Admitting: Oncology

## 2017-06-30 ENCOUNTER — Inpatient Hospital Stay: Payer: Medicaid Other

## 2017-06-30 VITALS — BP 110/63 | HR 93 | Temp 97.4°F | Resp 20 | Wt 134.1 lb

## 2017-06-30 DIAGNOSIS — O99013 Anemia complicating pregnancy, third trimester: Secondary | ICD-10-CM

## 2017-06-30 DIAGNOSIS — E538 Deficiency of other specified B group vitamins: Secondary | ICD-10-CM | POA: Diagnosis not present

## 2017-06-30 LAB — CBC WITH DIFFERENTIAL/PLATELET
BASOS PCT: 0 %
Basophils Absolute: 0 10*3/uL (ref 0–0.1)
Eosinophils Absolute: 0.1 10*3/uL (ref 0–0.7)
Eosinophils Relative: 1 %
HEMATOCRIT: 25.7 % — AB (ref 35.0–47.0)
HEMOGLOBIN: 8.7 g/dL — AB (ref 12.0–16.0)
Lymphocytes Relative: 20 %
Lymphs Abs: 2.6 10*3/uL (ref 1.0–3.6)
MCH: 28.5 pg (ref 26.0–34.0)
MCHC: 33.9 g/dL (ref 32.0–36.0)
MCV: 84.1 fL (ref 80.0–100.0)
Monocytes Absolute: 0.8 10*3/uL (ref 0.2–0.9)
Monocytes Relative: 7 %
NEUTROS ABS: 9.4 10*3/uL — AB (ref 1.4–6.5)
NEUTROS PCT: 72 %
Platelets: 349 10*3/uL (ref 150–440)
RBC: 3.06 MIL/uL — AB (ref 3.80–5.20)
RDW: 13.1 % (ref 11.5–14.5)
WBC: 12.9 10*3/uL — AB (ref 3.6–11.0)

## 2017-06-30 LAB — FERRITIN: Ferritin: 4 ng/mL — ABNORMAL LOW (ref 11–307)

## 2017-06-30 LAB — IRON AND TIBC
Iron: 16 ug/dL — ABNORMAL LOW (ref 28–170)
Saturation Ratios: 3 % — ABNORMAL LOW (ref 10.4–31.8)
TIBC: 591 ug/dL — ABNORMAL HIGH (ref 250–450)
UIBC: 575 ug/dL

## 2017-06-30 LAB — FOLATE: FOLATE: 18.7 ng/mL (ref >5.9)

## 2017-06-30 NOTE — Progress Notes (Signed)
Patient here today for initial evaluation regarding anemia in pregnancy.  

## 2017-07-01 LAB — VITAMIN B12: Vitamin B-12: 111 pg/mL — ABNORMAL LOW (ref 180–914)

## 2017-07-07 ENCOUNTER — Inpatient Hospital Stay: Payer: Medicaid Other

## 2017-07-07 VITALS — BP 95/60 | HR 85 | Temp 99.0°F | Resp 20

## 2017-07-07 DIAGNOSIS — O99013 Anemia complicating pregnancy, third trimester: Secondary | ICD-10-CM | POA: Diagnosis not present

## 2017-07-07 MED ORDER — CYANOCOBALAMIN 1000 MCG/ML IJ SOLN
1000.0000 ug | Freq: Once | INTRAMUSCULAR | Status: AC
  Administered 2017-07-07: 1000 ug via INTRAMUSCULAR
  Filled 2017-07-07: qty 1

## 2017-07-07 MED ORDER — FERUMOXYTOL INJECTION 510 MG/17 ML
510.0000 mg | Freq: Once | INTRAVENOUS | Status: AC
  Administered 2017-07-07: 510 mg via INTRAVENOUS
  Filled 2017-07-07: qty 17

## 2017-07-07 MED ORDER — SODIUM CHLORIDE 0.9 % IV SOLN
Freq: Once | INTRAVENOUS | Status: AC
  Administered 2017-07-07: 14:00:00 via INTRAVENOUS
  Filled 2017-07-07: qty 1000

## 2017-07-11 ENCOUNTER — Encounter: Payer: Self-pay | Admitting: Advanced Practice Midwife

## 2017-07-11 ENCOUNTER — Ambulatory Visit (INDEPENDENT_AMBULATORY_CARE_PROVIDER_SITE_OTHER): Payer: Medicaid Other | Admitting: Advanced Practice Midwife

## 2017-07-11 VITALS — BP 100/50 | Wt 136.0 lb

## 2017-07-11 DIAGNOSIS — Z3A32 32 weeks gestation of pregnancy: Secondary | ICD-10-CM

## 2017-07-11 NOTE — Progress Notes (Signed)
Routine Prenatal Care Visit  Subjective  [name] [name] is a 22 y.o. G3P2002 at [redacted]w[redacted]d being seen today for ongoing prenatal care.  She is currently monitored for the following issues for this high-risk pregnancy and has History of gestational hypertension; Pregnancy, supervision, high-risk; Nausea and vomiting during pregnancy; Migraine; and Anemia of mother in pregnancy on their problem list.  ----------------------------------------------------------------------------------- Patient reports intermittent back pain every 5-10 minutes last night, pubic area pressure, feet tingling/burning feeling especially at night. She admits to inadequate hydration. She says her urine is usually dark.  FMLA papers signed today. She has had one appointment with hematology and will have another one this Thursday. Contractions: Not present. Vag. Bleeding: None.  Movement: Present. Denies leaking of fluid.  ----------------------------------------------------------------------------------- [name], current medications, past family history, past medical history, past social history, past surgical history and problem list. Problem list updated.   Objective  Blood pressure (!) 100/50, weight 136 lb (61.7 kg), last menstrual period 10/30/2016 Pregravid weight 103 lb (46.7 kg) Total Weight Gain 33 lb (15 kg) Urinalysis: Urine Protein: Trace Urine Glucose: Trace  Fetal Status: Fetal Heart Rate (bpm): 145 Fundal Height: 33 cm Movement: Present     General:  Alert, oriented and cooperative. Patient is in no acute distress.  Skin: Skin is warm and dry. No rash noted.   Cardiovascular: Normal heart rate noted  Respiratory: Normal respiratory effort, no problems with respiration noted  Abdomen: Soft, gravid, appropriate for gestational age. Pain/Pressure: Present     Pelvic:  Cervical exam deferred        Extremities: Normal range  of motion.  Edema: None  Mental Status: Normal mood and affect. Normal behavior. Normal judgment and thought content.   Assessment   22 y.o. [id number] at [redacted]w[redacted]d by  09/04/2017, by Ultrasound presenting for routine prenatal visit  Plan   pregnancy Problems (from 01/10/17 to present)    Problem Noted Resolved   Anemia of mother in pregnancy 06/17/2017 by Vena Austria, MD No   Overview Signed 06/17/2017 12:37 PM by Vena Austria, MD    Hgb 8.6 at 28 week labs despite already being on po iron, hematology referral placed      Nausea and vomiting during pregnancy 01/20/2017 by Oswaldo Conroy, CNM No   Pregnancy, supervision, high-risk 01/10/2017 by Tresea Mall, CNM No   Overview Addendum 06/27/2017 11:00 AM by Oswaldo Conroy, CNM    [facility] Prenatal Labs  Dating 7 week [place] Blood type: A/Positive/-- (10/15 1153)   Genetic Screen 1 Screen: Normal  AFP negative    Antibody:Negative (10/15 1153)  Anatomic [place] Normal Female Rubella: 26.30 (10/15 1153) Varicella: Immune  GTT 84 RPR: Non Reactive (10/15 1153)   Rhogam  Not applicable HBsAg: Negative (10/15 1153)   TDaP vaccine  06/27/2017                      Flu Shot: 04/26/17 HIV: Non Reactive (10/15 1153)   Baby Food                                GBS:   Contraception  BTL consent 06/27/2017 Pap: 05/19/2016 ASCUS HPV positive  CBB     CS/VBAC NA   Support Person Partner: [name]          History of gestational hypertension 11/28/2011 by [name], CNM  No   Overview Addendum 03/01/2017  9:36 PM by Farrel ConnersGutierrez, Colleen, CNM    24 hour protein 2032 on magnesium 24 hours pp With first pregnancy          Preterm labor symptoms and general obstetric precautions including but not limited to vaginal bleeding, contractions, leaking of fluid and fetal movement were reviewed in detail with the patient. Please refer to After Visit Summary for other counseling recommendations.  Recommend drink h2o til urine is clear to light yellow  for adequate hydration. Abdominal/hip support band for pubic area pressure.  Hands and knees/stretching for improved fetal positioning and improved LE circulation.  Return in about 2 weeks (around 07/25/2017) for rob.  Tresea MallJane Evona Westra, CNM 07/11/2017 11:25 AM

## 2017-07-11 NOTE — Patient Instructions (Signed)
Third Trimester of Pregnancy The third trimester is from week 28 through week 40 (months 7 through 9). The third trimester is a time when the unborn baby (fetus) is growing rapidly. At the end of the ninth month, the fetus is about 20 inches in length and weighs 6-10 pounds. Body changes during your third trimester Your body will continue to go through many changes during pregnancy. The changes vary from woman to woman. During the third trimester:  Your weight will continue to increase. You can expect to gain 25-35 pounds (11-16 kg) by the end of the pregnancy.  You may begin to get stretch marks on your hips, abdomen, and breasts.  You may urinate more often because the fetus is moving lower into your pelvis and pressing on your bladder.  You may develop or continue to have heartburn. This is caused by increased hormones that slow down muscles in the digestive tract.  You may develop or continue to have constipation because increased hormones slow digestion and cause the muscles that push waste through your intestines to relax.  You may develop hemorrhoids. These are swollen veins (varicose veins) in the rectum that can itch or be painful.  You may develop swollen, bulging veins (varicose veins) in your legs.  You may have increased body aches in the pelvis, back, or thighs. This is due to weight gain and increased hormones that are relaxing your joints.  You may have changes in your hair. These can include thickening of your hair, rapid growth, and changes in texture. Some women also have hair loss during or after pregnancy, or hair that feels dry or thin. Your hair will most likely return to normal after your baby is born.  Your breasts will continue to grow and they will continue to become tender. A yellow fluid (colostrum) may leak from your breasts. This is the first milk you are producing for your baby.  Your belly button may stick out.  You may notice more swelling in your hands,  face, or ankles.  You may have increased tingling or numbness in your hands, arms, and legs. The skin on your belly may also feel numb.  You may feel short of breath because of your expanding uterus.  You may have more problems sleeping. This can be caused by the size of your belly, increased need to urinate, and an increase in your body's metabolism.  You may notice the fetus "dropping," or moving lower in your abdomen (lightening).  You may have increased vaginal discharge.  You may notice your joints feel loose and you may have pain around your pelvic bone.  What to expect at prenatal visits You will have prenatal exams every 2 weeks until week 36. Then you will have weekly prenatal exams. During a routine prenatal visit:  You will be weighed to make sure you and the baby are growing normally.  Your blood pressure will be taken.  Your abdomen will be measured to track your baby's growth.  The fetal heartbeat will be listened to.  Any test results from the previous visit will be discussed.  You may have a cervical check near your due date to see if your cervix has softened or thinned (effaced).  You will be tested for Group B streptococcus. This happens between 35 and 37 weeks.  Your health care provider may ask you:  What your birth plan is.  How you are feeling.  If you are feeling the baby move.  If you have had   any abnormal symptoms, such as leaking fluid, bleeding, severe headaches, or abdominal cramping.  If you are using any tobacco products, including cigarettes, chewing tobacco, and electronic cigarettes.  If you have any questions.  Other tests or screenings that may be performed during your third trimester include:  Blood tests that check for low iron levels (anemia).  Fetal testing to check the health, activity level, and growth of the fetus. Testing is done if you have certain medical conditions or if there are problems during the  pregnancy.  Nonstress test (NST). This test checks the health of your baby to make sure there are no signs of problems, such as the baby not getting enough oxygen. During this test, a belt is placed around your belly. The baby is made to move, and its heart rate is monitored during movement.  What is false labor? False labor is a condition in which you feel small, irregular tightenings of the muscles in the womb (contractions) that usually go away with rest, changing position, or drinking water. These are called Braxton Hicks contractions. Contractions may last for hours, days, or even weeks before true labor sets in. If contractions come at regular intervals, become more frequent, increase in intensity, or become painful, you should see your health care provider. What are the signs of labor?  Abdominal cramps.  Regular contractions that start at 10 minutes apart and become stronger and more frequent with time.  Contractions that start on the top of the uterus and spread down to the lower abdomen and back.  Increased pelvic pressure and dull back pain.  A watery or bloody mucus discharge that comes from the vagina.  Leaking of amniotic fluid. This is also known as your "water breaking." It could be a slow trickle or a gush. Let your health care provider know if it has a color or strange odor. If you have any of these signs, call your health care provider right away, even if it is before your due date. Follow these instructions at home: Medicines  Follow your health care provider's instructions regarding medicine use. Specific medicines may be either safe or unsafe to take during pregnancy.  Take a prenatal vitamin that contains at least 600 micrograms (mcg) of folic acid.  If you develop constipation, try taking a stool softener if your health care provider approves. Eating and drinking  Eat a balanced diet that includes fresh fruits and vegetables, whole grains, good sources of protein  such as meat, eggs, or tofu, and low-fat dairy. Your health care provider will help you determine the amount of weight gain that is right for you.  Avoid raw meat and uncooked cheese. These carry germs that can cause birth defects in the baby.  If you have low calcium intake from food, talk to your health care provider about whether you should take a daily calcium supplement.  Eat four or five small meals rather than three large meals a day.  Limit foods that are high in fat and processed sugars, such as fried and sweet foods.  To prevent constipation: ? Drink enough fluid to keep your urine clear or pale yellow. ? Eat foods that are high in fiber, such as fresh fruits and vegetables, whole grains, and beans. Activity  Exercise only as directed by your health care provider. Most women can continue their usual exercise routine during pregnancy. Try to exercise for 30 minutes at least 5 days a week. Stop exercising if you experience uterine contractions.  Avoid heavy   lifting.  Do not exercise in extreme heat or humidity, or at high altitudes.  Wear low-heel, comfortable shoes.  Practice good posture.  You may continue to have sex unless your health care provider tells you otherwise. Relieving pain and discomfort  Take frequent breaks and rest with your legs elevated if you have leg cramps or low back pain.  Take warm sitz baths to soothe any pain or discomfort caused by hemorrhoids. Use hemorrhoid cream if your health care provider approves.  Wear a good support bra to prevent discomfort from breast tenderness.  If you develop varicose veins: ? Wear support pantyhose or compression stockings as told by your healthcare provider. ? Elevate your feet for 15 minutes, 3-4 times a day. Prenatal care  Write down your questions. Take them to your prenatal visits.  Keep all your prenatal visits as told by your health care provider. This is important. Safety  Wear your seat belt at  all times when driving.  Make a list of emergency phone numbers, including numbers for family, friends, the hospital, and police and fire departments. General instructions  Avoid cat litter boxes and soil used by cats. These carry germs that can cause birth defects in the baby. If you have a cat, ask someone to clean the litter box for you.  Do not travel far distances unless it is absolutely necessary and only with the approval of your health care provider.  Do not use hot tubs, steam rooms, or saunas.  Do not drink alcohol.  Do not use any products that contain nicotine or tobacco, such as cigarettes and e-cigarettes. If you need help quitting, ask your health care provider.  Do not use any medicinal herbs or unprescribed drugs. These chemicals affect the formation and growth of the baby.  Do not douche or use tampons or scented sanitary pads.  Do not cross your legs for long periods of time.  To prepare for the arrival of your baby: ? Take prenatal classes to understand, practice, and ask questions about labor and delivery. ? Make a trial run to the hospital. ? Visit the hospital and tour the maternity area. ? Arrange for maternity or paternity leave through employers. ? Arrange for family and friends to take care of pets while you are in the hospital. ? Purchase a rear-facing car seat and make sure you know how to install it in your car. ? Pack your hospital bag. ? Prepare the baby's nursery. Make sure to remove all pillows and stuffed animals from the baby's crib to prevent suffocation.  Visit your dentist if you have not gone during your pregnancy. Use a soft toothbrush to brush your teeth and be gentle when you floss. Contact a health care provider if:  You are unsure if you are in labor or if your water has broken.  You become dizzy.  You have mild pelvic cramps, pelvic pressure, or nagging pain in your abdominal area.  You have lower back pain.  You have persistent  nausea, vomiting, or diarrhea.  You have an unusual or bad smelling vaginal discharge.  You have pain when you urinate. Get help right away if:  Your water breaks before 37 weeks.  You have regular contractions less than 5 minutes apart before 37 weeks.  You have a fever.  You are leaking fluid from your vagina.  You have spotting or bleeding from your vagina.  You have severe abdominal pain or cramping.  You have rapid weight loss or weight gain.    You have shortness of breath with chest pain.  You notice sudden or extreme swelling of your face, hands, ankles, feet, or legs.  Your baby makes fewer than 10 movements in 2 hours.  You have severe headaches that do not go away when you take medicine.  You have vision changes. Summary  The third trimester is from week 28 through week 40, months 7 through 9. The third trimester is a time when the unborn baby (fetus) is growing rapidly.  During the third trimester, your discomfort may increase as you and your baby continue to gain weight. You may have abdominal, leg, and back pain, sleeping problems, and an increased need to urinate.  During the third trimester your breasts will keep growing and they will continue to become tender. A yellow fluid (colostrum) may leak from your breasts. This is the first milk you are producing for your baby.  False labor is a condition in which you feel small, irregular tightenings of the muscles in the womb (contractions) that eventually go away. These are called Braxton Hicks contractions. Contractions may last for hours, days, or even weeks before true labor sets in.  Signs of labor can include: abdominal cramps; regular contractions that start at 10 minutes apart and become stronger and more frequent with time; watery or bloody mucus discharge that comes from the vagina; increased pelvic pressure and dull back pain; and leaking of amniotic fluid. This information is not intended to replace advice  given to you by your health care provider. Make sure you discuss any questions you have with your health care provider. Document Released: 03/09/2001 Document Revised: 08/21/2015 Document Reviewed: 05/16/2012 Elsevier Interactive Patient Education  2017 [employer]  

## 2017-07-11 NOTE — Progress Notes (Signed)
ROB Vaginal pressure 

## 2017-07-13 ENCOUNTER — Encounter: Payer: Self-pay | Admitting: Obstetrics and Gynecology

## 2017-07-14 ENCOUNTER — Inpatient Hospital Stay: Payer: Medicaid Other

## 2017-07-14 ENCOUNTER — Telehealth: Payer: Self-pay | Admitting: Obstetrics and Gynecology

## 2017-07-14 ENCOUNTER — Encounter: Payer: Medicaid Other | Admitting: Obstetrics and Gynecology

## 2017-07-14 VITALS — BP 108/63 | HR 99 | Temp 99.1°F | Resp 19

## 2017-07-14 DIAGNOSIS — O99013 Anemia complicating pregnancy, third trimester: Secondary | ICD-10-CM

## 2017-07-14 MED ORDER — SODIUM CHLORIDE 0.9 % IV SOLN
510.0000 mg | Freq: Once | INTRAVENOUS | Status: AC
  Administered 2017-07-14: 510 mg via INTRAVENOUS
  Filled 2017-07-14: qty 17

## 2017-07-14 MED ORDER — SODIUM CHLORIDE 0.9 % IV SOLN
Freq: Once | INTRAVENOUS | Status: AC
  Administered 2017-07-14: 14:00:00 via INTRAVENOUS
  Filled 2017-07-14: qty 1000

## 2017-07-14 NOTE — Telephone Encounter (Signed)
Please call paient and schedule for OB Headaches/dizziness. If no openings today, definitely tomorrow with someone. Thank you  ----- Message -----  From: Norberto SorensonSandra Sjogren  Sent: 07/13/2017  2:01 PM  To: [facility]  Subject: Non-Urgent Medical Question             ----- Message from Mychart, Generic sent at 07/13/2017 2:01 PM EDT -----    My head aches are brutal, my blood pressure seems to be fine though, I've tried multiple medications for the head aches and nothing is helping, it's beginning to keep me from wanting to get up from bed. My vision is off, I'm getting nauseated and even thrown up several times due to them recently. Not sure of what's causing them or how to help them.   Called and left voicemail for patient to call back to confirmed schedule appointment.

## 2017-07-25 ENCOUNTER — Encounter: Payer: Self-pay | Admitting: Maternal Newborn

## 2017-07-25 ENCOUNTER — Ambulatory Visit (INDEPENDENT_AMBULATORY_CARE_PROVIDER_SITE_OTHER): Payer: Medicaid Other | Admitting: Maternal Newborn

## 2017-07-25 VITALS — BP 100/60 | Wt 141.0 lb

## 2017-07-25 DIAGNOSIS — O0993 Supervision of high risk pregnancy, unspecified, third trimester: Secondary | ICD-10-CM

## 2017-07-25 DIAGNOSIS — Z3A34 34 weeks gestation of pregnancy: Secondary | ICD-10-CM

## 2017-07-25 DIAGNOSIS — G43709 Chronic migraine without aura, not intractable, without status migrainosus: Secondary | ICD-10-CM

## 2017-07-25 DIAGNOSIS — O219 Vomiting of pregnancy, unspecified: Secondary | ICD-10-CM

## 2017-07-25 MED ORDER — ONDANSETRON 4 MG PO TBDP: 4.0000 mg | ORAL_TABLET | Freq: Four times a day (QID) | ORAL | 0 refills | Status: DC | PRN

## 2017-07-25 NOTE — Progress Notes (Signed)
Routine Prenatal Care Visit  Subjective  [name] [name] is a 22 y.o. G3P2002 at [redacted]w[redacted]d being seen today for ongoing prenatal care.  She is currently monitored for the following issues for this high-risk pregnancy and has History of gestational hypertension; Pregnancy, supervision, high-risk; Nausea and vomiting during pregnancy; Migraine; and Anemia of mother in pregnancy on their problem list.  ----------------------------------------------------------------------------------- Patient reports migraine headaches. They have been bad enough over the past few days to cause vomiting. Triggered by fatigue and eating  meals. Nothing really helps relieve them except rest. She has also been having difficulty sleeping as it is hard to get comfortable with baby low in her pelvis. No visual disturbances or epigastric pain. Contractions: Not present. Vag. Bleeding: None.  Movement: Present. Denies leaking of fluid.  ----------------------------------------------------------------------------------- [name], current medications, past family history, past medical history, past social history, past surgical history and problem list. Problem list updated.   Objective  Blood pressure 100/60, weight 141 lb (64 kg), last menstrual period 10/30/2016. Pregravid weight 103 lb (46.7 kg) Total Weight Gain 38 lb (17.2 kg) Urinalysis: Urine Protein: Negative Urine Glucose: Negative  Fetal Status: Fetal Heart Rate (bpm): 130 Fundal Height: 34 cm Movement: Present     General:  Alert, oriented and cooperative. Patient is in no acute distress.  Skin: Skin is warm and dry. No rash noted.   Cardiovascular: Normal heart rate noted  Respiratory: Normal respiratory effort, no problems with respiration noted  Abdomen: Soft, gravid, appropriate for gestational age. Pain/Pressure: Absent     Pelvic:  Cervical exam deferred         Extremities: Normal range of motion.  Edema: None  Mental Status: Normal mood and affect. Normal behavior. Normal judgment and thought content.     Assessment   21 y.o. [id number] at [redacted]w[redacted]d, EDD 09/04/2017 by Ultrasound presenting for routine prenatal visit.  Plan   pregnancy Problems (from 01/10/17 to present)    Problem Noted Resolved   Anemia of mother in pregnancy 06/17/2017 by Vena Austria, MD No   Overview Signed 06/17/2017 12:37 PM by Vena Austria, MD    Hgb 8.6 at 28 week labs despite already being on po iron, hematology referral placed      Nausea and vomiting during pregnancy 01/20/2017 by Oswaldo Conroy, CNM No   Pregnancy, supervision, high-risk 01/10/2017 by Tresea Mall, CNM No   Overview Addendum 06/27/2017 11:00 AM by Oswaldo Conroy, CNM    [facility]inic [facility] Prenatal Labs  Dating 7 week [place] Blood type: A/Positive/-- (10/15 1153)   Genetic Screen 1 Screen: Normal  AFP negative    Antibody:Negative (10/15 1153)  Anatomic [place] Normal Female Rubella: 26.30 (10/15 1153) Varicella: Immune  GTT 84 RPR: Non Reactive (10/15 1153)   Rhogam  Not applicable HBsAg: Negative (10/15 1153)   TDaP vaccine  06/27/2017                      Flu Shot: 04/26/17 HIV: Non Reactive (10/15 1153)   Baby Food                                GBS:   Contraception  BTL consent 06/27/2017 Pap: 05/19/2016 ASCUS HPV positive  CBB     CS/VBAC NA   Support Person Partner: [name]  History of gestational hypertension 11/28/2011 by [name], CNM No   Overview Addendum 03/01/2017  9:36 PM by Farrel Conners, CNM    24 hour protein 2032 on magnesium 24 hours pp With first pregnancy       Discussed relief for migraines; medications that she has tried in the past have not helped much. Renewed Rx for Zofran to help with nausea/vomiting. Encouraged rest as frequently as possible.  General obstetric precautions reviewed.  Return in about 2 weeks (around 08/08/2017) for  ROB.  Marcelyn Bruins, CNM 07/25/2017  1:14 PM

## 2017-08-03 ENCOUNTER — Encounter: Payer: Self-pay | Admitting: *Deleted

## 2017-08-03 ENCOUNTER — Observation Stay
Admission: EM | Admit: 2017-08-03 | Discharge: 2017-08-03 | Disposition: A | Discharge status: Home / Self Care | Payer: Medicaid Other | Attending: Certified Nurse Midwife | Admitting: Certified Nurse Midwife

## 2017-08-03 DIAGNOSIS — O99013 Anemia complicating pregnancy, third trimester: Secondary | ICD-10-CM

## 2017-08-03 DIAGNOSIS — O26893 Other specified pregnancy related conditions, third trimester: Secondary | ICD-10-CM | POA: Insufficient documentation

## 2017-08-03 DIAGNOSIS — R102 Pelvic and perineal pain: Secondary | ICD-10-CM | POA: Diagnosis not present

## 2017-08-03 DIAGNOSIS — Z3A35 35 weeks gestation of pregnancy: Secondary | ICD-10-CM | POA: Diagnosis not present

## 2017-08-03 DIAGNOSIS — O4703 False labor before 37 completed weeks of gestation, third trimester: Secondary | ICD-10-CM | POA: Diagnosis present

## 2017-08-03 DIAGNOSIS — Z880 Allergy status to penicillin: Secondary | ICD-10-CM | POA: Insufficient documentation

## 2017-08-03 DIAGNOSIS — O26899 Other specified pregnancy related conditions, unspecified trimester: Secondary | ICD-10-CM | POA: Diagnosis present

## 2017-08-03 DIAGNOSIS — O219 Vomiting of pregnancy, unspecified: Secondary | ICD-10-CM

## 2017-08-03 DIAGNOSIS — R109 Unspecified abdominal pain: Secondary | ICD-10-CM

## 2017-08-03 DIAGNOSIS — O0993 Supervision of high risk pregnancy, unspecified, third trimester: Secondary | ICD-10-CM

## 2017-08-03 DIAGNOSIS — Z8759 Personal history of other complications of pregnancy, childbirth and the puerperium: Secondary | ICD-10-CM

## 2017-08-03 HISTORY — DX: Severe pre-eclampsia, unspecified trimester: O14.10

## 2017-08-03 LAB — URINALYSIS, COMPLETE (UACMP) WITH MICROSCOPIC
BILIRUBIN URINE: NEGATIVE
GLUCOSE, UA: NEGATIVE mg/dL
HGB URINE DIPSTICK: NEGATIVE
Ketones, ur: NEGATIVE mg/dL
LEUKOCYTES UA: NEGATIVE
NITRITE: NEGATIVE
PROTEIN: NEGATIVE mg/dL
Specific Gravity, Urine: 1.009 (ref 1.005–1.030)
pH: 6 (ref 5.0–8.0)

## 2017-08-03 NOTE — Discharge Instructions (Signed)
Come back if: ° °Big gush of fluids °Decreased fetal movement °Heavy vaginal bleeding °Temp over 100.4 °Contractions every 3-5 min lasting at least one hour ° °Get plenty of rest and stay well hydrated! °

## 2017-08-03 NOTE — Final Progress Note (Signed)
Physician Final Progress Note  Patient ID: [name] [name] MRN: [id number] DOB/AGE: 1995-08-28 22 y.o.  Admit date: 08/03/2017 Admitting provider: Nadara Mustard, MD/ Gasper Lloyd. Sharen Hones, CNM Discharge date: 08/04/2017   Admission Diagnoses: IUP at 35 weeks with contractions x 3 days and bilateral lower abdominal pains.  Discharge Diagnoses:  Round ligament pain and BH contractions at 35 wk3days  Consults: None  Significant Findings/ Diagnostic Studies: 22 year old G3 P2002 with EDC=09/04/2017 by a 7wk4d ultrasound presents to L&D at 35.3weeks with complaints of irregular contractions x 3 days accompanied by pinching pulling pain in her lower abdomen bilaterally and sometimes by rectal pressure, especially when bending over. Rates pain 2 out of 10.  Has had a clear, mucoid thick discharge for several days without odor or vulvovaginal itching. Denies vaginal bleeding. Has had intermittent nausea and vomiting. Took Zofran 3 days ago. No diarrhea or fever.  Prenatal care at [facility] has also been remarkable for a 38 # weight gain, anemia requiring iron infusions (has had 2 infusions), migraines, a history of preeclampsia with G1,  and : [facility] Prenatal Labs  Dating 7 week [place] Blood type: A/Positive/-- (10/15 1153)   Genetic Screen 1 Screen: Normal  AFP negative    Antibody:Negative (10/15 1153)  Anatomic [place] Normal Female Rubella: 26.30 (10/15 1153) Varicella: Immune  GTT 84 RPR: Non Reactive (10/15 1153)   Rhogam  Not applicable HBsAg: Negative (10/15 1153)   TDaP vaccine  06/27/2017                      Flu Shot: 04/26/17 HIV: Non Reactive (10/15 1153)   Baby Food                                GBS:   Contraception  BTL consent 06/27/2017 Pap: 05/19/2016 ASCUS HPV positive  CBB     CS/VBAC NA   Support Person Partner: [name]    OB History  Gravida Para Term Preterm AB Living  0 0 2  SAB TAB Ectopic Multiple Live Births  0 0 0 0 2    # Outcome Date GA Lbr Len/2nd Weight  Sex Delivery Anes PTL Lv  3 Current           2 Term 03/20/16 [redacted]w[redacted]d  3.402 kg (7 lb 8 oz) F Vag-Spont   LIV  1 Term 11/27/11 [redacted]w[redacted]d 10:35 / 01:16 3.966 kg (8 lb 11.9 oz) M Vag-Spont Local, EPI N LIV     Complications: Preeclampsia, severe   Past Medical History:  Diagnosis Date  . Anemia   . Breast mass   . H/O rubella   . H/O varicella   . Hx of ovarian cyst   . Preeclampsia, severe    with 1st pregnancy  . Yeast infection    Past Surgical History:  Procedure Laterality Date  . AUGMENTATION MAMMAPLASTY    . NO PAST SURGERIES     Family History  Problem Relation Age of Onset  . Alcohol abuse Mother   . Depression Mother   . Alcohol abuse Father   . Diabetes Maternal Grandmother   . Breast cancer Maternal Grandmother   . Mental retardation Cousin   . Autism Cousin   . Breast cancer Maternal Aunt    Social History   Socioeconomic History  . Marital status: Legally Separated    Spouse name: Not on file  .  Number of children: 2  . Years of education: Not on file  . Highest education level: Not on file  Occupational History  . Not on file  Social Needs  . Financial resource strain: Not on file  . Food insecurity:    Worry: Not on file    Inability: Not on file  . Transportation needs:    Medical: Not on file    Non-medical: Not on file  Tobacco Use  . Smoking status: Never Smoker  . Smokeless tobacco: Never Used  Substance and Sexual Activity  . Alcohol use: No  . Drug use: No  . Sexual activity: Yes    Birth control/protection: None    Comment: currently pregnant  Lifestyle  . Physical activity:    Days per week: Not on file    Minutes per session: Not on file  . Stress: Not on file  Relationships  . Social connections:    Talks on phone: Not on file    Gets together: Not on file    Attends religious service: Not on file    Active member of club or organization: Not on file    Attends meetings of clubs or organizations: Not on file    Relationship  status: Not on file  . Intimate partner violence:    Fear of current or ex partner: Not on file    Emotionally abused: Not on file    Physically abused: Not on file    Forced sexual activity: Not on file  Other Topics Concern  . Not on file  Social History Narrative  . Not on file   Exam: General: White gravid female in NAD Vital signs: BP 117/65 (BP Location: Right Arm)   Pulse 100   Temp 98.1 F (36.7 C) (Oral)   Resp 16   LMP 10/30/2016    Pulmonary: normal respiratory effort Heart: regular rate Abdomen: soft, NT, cephalic presentation on Leopold's,  Pelvic : External/BUS: no lesions or inflammation Vagina: clear-white discharge Wet prep: negative clue cells, hyphae, Trichimonas Cervix: closed/ long/ soft/posterior/-2 to -3 and ballotable  FHR: reactive, Cat1 tracing, with baseline 135 and accelerations at least 15x15 Toco: irregular mild contractions Extremities: no edema Neuro: alert, awake, oriented x 3 Psyche: normal mood and full affect  Results for orders placed or performed during the hospital encounter of 08/03/17 (from the past 24 hour(s))  Urinalysis, Complete w Microscopic     Status: Abnormal   Collection Time: 08/03/17  9:45 PM  Result Value Ref Range   Color, Urine YELLOW (A) YELLOW   APPearance CLEAR (A) CLEAR   Specific Gravity, Urine 1.009 1.005 - 1.030   pH 6.0 5.0 - 8.0   Glucose, UA NEGATIVE NEGATIVE mg/dL   Hgb urine dipstick NEGATIVE NEGATIVE   Bilirubin Urine NEGATIVE NEGATIVE   Ketones, ur NEGATIVE NEGATIVE mg/dL   Protein, ur NEGATIVE NEGATIVE mg/dL   Nitrite NEGATIVE NEGATIVE   Leukocytes, UA NEGATIVE NEGATIVE   WBC, UA 0-5 0 - 5 WBC/hpf   Bacteria, UA RARE (A) NONE SEEN   Squamous Epithelial / LPF 0-5 0 - 5   Mucus PRESENT    A: IUP at 35.3 weeks with discomforts of pregnancy Not in labor  P: Discussed use of maternity support garment for round ligament discomfort Reassured that she is not in labor Follow up for ROB on  08/08/2017 Labor precautions Reassuring FHR tracing  Procedures: none  Discharge Condition: stable  Disposition: Home  Diet: Regular diet  Discharge Activity: Activity  as tolerated   Allergies as of 08/03/2017      Reactions   Penicillins Hives      Medication List    TAKE these medications   ondansetron 4 MG disintegrating tablet Commonly known as:  ZOFRAN ODT Take 1 tablet (4 mg total) by mouth every 6 (six) hours as needed for nausea.   PRENATAL VITAMIN PO Take by mouth.      Follow-up Information    [facility] Follow up.   Contact information: [address] [place] [address] [phone]          Total time spent taking care of this patient: 20 minutes  Signed: Farrel Conners 08/04/2017, 7:46 PM

## 2017-08-03 NOTE — OB Triage Note (Signed)
Recvd pt from ED. Pt c/o contractions for the past 3 days but are very random. Complains of pressure and mucousy discharge today. Feeling baby move well. No complications with this pregnancy. Rates pain a 2 out of 10.

## 2017-08-04 ENCOUNTER — Encounter: Payer: Self-pay | Admitting: Certified Nurse Midwife

## 2017-08-08 ENCOUNTER — Ambulatory Visit (INDEPENDENT_AMBULATORY_CARE_PROVIDER_SITE_OTHER): Payer: Medicaid Other | Admitting: Obstetrics & Gynecology

## 2017-08-08 VITALS — BP 100/60 | Wt 142.0 lb

## 2017-08-08 DIAGNOSIS — O0993 Supervision of high risk pregnancy, unspecified, third trimester: Secondary | ICD-10-CM

## 2017-08-08 DIAGNOSIS — Z3A36 36 weeks gestation of pregnancy: Secondary | ICD-10-CM

## 2017-08-08 NOTE — Progress Notes (Signed)
  Subjective  Fetal Movement? yes Contractions? no Leaking Fluid? no Vaginal Bleeding? no  Objective  BP 100/60   Wt 142 lb (64.4 kg)   LMP 10/30/2016   BMI 26.83 kg/m  General: NAD Pumonary: no increased work of breathing Abdomen: gravid, non-tender Extremities: no edema Psychiatric: mood appropriate, affect full  Assessment  22 y.o. [id number] at [redacted]w[redacted]d by  09/04/2017, by Ultrasound presenting for routine prenatal visit  Plan   Problem List Items Addressed This Visit      Other   Pregnancy, supervision, high-risk    Other Visit Diagnoses    [redacted] weeks gestation of pregnancy    -  Primary   Relevant Orders   Culture, beta strep (group b only)    GC/Chl and GBBS today  Annamarie Major, MD, [facility], [facility] 08/08/2017  11:23 AM

## 2017-08-08 NOTE — Patient Instructions (Signed)
Braxton Hicks Contractions °Contractions of the uterus can occur throughout pregnancy, but they are not always a sign that you are in labor. You may have practice contractions called Braxton Hicks contractions. These false labor contractions are sometimes confused with true labor. °What are Braxton Hicks contractions? °Braxton Hicks contractions are tightening movements that occur in the muscles of the uterus before labor. Unlike true labor contractions, these contractions do not result in opening (dilation) and thinning of the cervix. Toward the end of pregnancy (32-34 weeks), Braxton Hicks contractions can happen more often and may become stronger. These contractions are sometimes difficult to tell apart from true labor because they can be very uncomfortable. You should not feel embarrassed if you go to the hospital with false labor. °Sometimes, the only way to tell if you are in true labor is for your health care provider to look for changes in the cervix. The health care provider will do a physical exam and may monitor your contractions. If you are not in true labor, the exam should show that your cervix is not dilating and your water has not broken. °If there are other health problems associated with your pregnancy, it is completely safe for you to be sent home with false labor. You may continue to have Braxton Hicks contractions until you go into true labor. °How to tell the difference between true labor and false labor °True labor °· Contractions last 30-70 seconds. °· Contractions become very regular. °· Discomfort is usually felt in the top of the uterus, and it spreads to the lower abdomen and low back. °· Contractions do not go away with walking. °· Contractions usually become more intense and increase in frequency. °· The cervix dilates and gets thinner. °False labor °· Contractions are usually shorter and not as strong as true labor contractions. °· Contractions are usually irregular. °· Contractions  are often felt in the front of the lower abdomen and in the groin. °· Contractions may go away when you walk around or change positions while lying down. °· Contractions get weaker and are shorter-lasting as time goes on. °· The cervix usually does not dilate or become thin. °Follow these instructions at home: °· Take over-the-counter and prescription medicines only as told by your health care provider. °· Keep up with your usual exercises and follow other instructions from your health care provider. °· Eat and drink lightly if you think you are going into labor. °· If Braxton Hicks contractions are making you uncomfortable: °? Change your position from lying down or resting to walking, or change from walking to resting. °? Sit and rest in a tub of warm water. °? Drink enough fluid to keep your urine pale yellow. Dehydration may cause these contractions. °? Do slow and deep breathing several times an hour. °· Keep all follow-up prenatal visits as told by your health care provider. This is important. °Contact a health care provider if: °· You have a fever. °· You have continuous pain in your abdomen. °Get help right away if: °· Your contractions become stronger, more regular, and closer together. °· You have fluid leaking or gushing from your vagina. °· You pass blood-tinged mucus (bloody show). °· You have bleeding from your vagina. °· You have low back pain that you never had before. °· You feel your baby’s head pushing down and causing pelvic pressure. °· Your baby is not moving inside you as much as it used to. °Summary °· Contractions that occur before labor are called Braxton   Hicks contractions, false labor, or practice contractions. °· Braxton Hicks contractions are usually shorter, weaker, farther apart, and less regular than true labor contractions. True labor contractions usually become progressively stronger and regular and they become more frequent. °· Manage discomfort from Braxton Hicks contractions by  changing position, resting in a warm bath, drinking plenty of water, or practicing deep breathing. °This information is not intended to replace advice given to you by your health care provider. Make sure you discuss any questions you have with your health care provider. °Document Released: 07/29/2016 Document Revised: 07/29/2016 Document Reviewed: 07/29/2016 °Elsevier Interactive Patient Education © 2018 [employer] Inc[employer] ° °

## 2017-08-10 LAB — GC/CHLAMYDIA PROBE AMP
CHLAMYDIA, DNA PROBE: NEGATIVE
Neisseria gonorrhoeae by PCR: NEGATIVE

## 2017-08-12 LAB — CULTURE, BETA STREP (GROUP B ONLY): STREP GP B CULTURE: NEGATIVE

## 2017-08-15 ENCOUNTER — Ambulatory Visit (INDEPENDENT_AMBULATORY_CARE_PROVIDER_SITE_OTHER): Payer: Medicaid Other | Admitting: Obstetrics and Gynecology

## 2017-08-15 ENCOUNTER — Encounter: Payer: Self-pay | Admitting: Obstetrics and Gynecology

## 2017-08-15 VITALS — BP 98/62 | Wt 144.0 lb

## 2017-08-15 DIAGNOSIS — Z8759 Personal history of other complications of pregnancy, childbirth and the puerperium: Secondary | ICD-10-CM

## 2017-08-15 DIAGNOSIS — O99013 Anemia complicating pregnancy, third trimester: Secondary | ICD-10-CM

## 2017-08-15 DIAGNOSIS — Z3A37 37 weeks gestation of pregnancy: Secondary | ICD-10-CM

## 2017-08-15 DIAGNOSIS — O0993 Supervision of high risk pregnancy, unspecified, third trimester: Secondary | ICD-10-CM

## 2017-08-15 NOTE — Progress Notes (Signed)
Pt c/o pelvic pressure, back pain and swelling in feet. Desires cervical check.

## 2017-08-15 NOTE — Progress Notes (Signed)
Routine Prenatal Care Visit  Subjective  [name] [name] is a 22 y.o. G3P2002 at [redacted]w[redacted]d being seen today for ongoing prenatal care.  She is currently monitored for the following issues for this high-risk pregnancy and has History of gestational hypertension; Pregnancy, supervision, high-risk; Nausea and vomiting during pregnancy; Migraine; Anemia of mother in pregnancy; and Abdominal cramping affecting pregnancy on their problem list.  ----------------------------------------------------------------------------------- Patient reports no complaints and feeling better this week. feels lighter and has not had a headache this week. Feels this is attributed to her iron transfusions..   Contractions: Irregular. Vag. Bleeding: None.  Movement: Present. Denies leaking of fluid.  ----------------------------------------------------------------------------------- [name], current medications, past family history, past medical history, past social history, past surgical history and problem list. Problem list updated.   Objective  Blood pressure 98/62, weight 144 lb (65.3 kg), last menstrual period 10/30/2016, unknown if currently breastfeeding. Pregravid weight 103 lb (46.7 kg) Total Weight Gain 41 lb (18.6 kg) Urinalysis: Urine Protein: Negative Urine Glucose: Negative  Fetal Status: Fetal Heart Rate (bpm): 142 Fundal Height: 36 cm Movement: Present     General:  Alert, oriented and cooperative. Patient is in no acute distress.  Skin: Skin is warm and dry. No rash noted.   Cardiovascular: Normal heart rate noted  Respiratory: Normal respiratory effort, no problems with respiration noted  Abdomen: Soft, gravid, appropriate for gestational age. Pain/Pressure: Absent     Pelvic:  Cervical exam performed Dilation: 1 Effacement (%): 0 Station: -3  Extremities: Normal range of motion.  Edema: None  ental Status:  Normal mood and affect. Normal behavior. Normal judgment and thought content.     Assessment   22 y.o. [id number] at [redacted]w[redacted]d by  09/04/2017, by Ultrasound presenting for routine prenatal visit  Plan   pregnancy Problems (from 01/10/17 to present)    Problem Noted Resolved   Anemia of mother in pregnancy 06/17/2017 by Vena Austria, MD No   Overview Signed 06/17/2017 12:37 PM by Vena Austria, MD    Hgb 8.6 at 28 week labs despite already being on po iron, hematology referral placed      Nausea and vomiting during pregnancy 01/20/2017 by Oswaldo Conroy, CNM No   Pregnancy, supervision, high-risk 01/10/2017 by Tresea Mall, CNM No   Overview Addendum 08/15/2017 11:40 AM by Natale Milch, MD    [facility] P[facility]  Dating 7 week [place] Blood type: A/Positive/-- (10/15 1153)   Genetic Screen 1 Screen: Normal  AFP negative    Antibody:Negative (10/15 1153)  Anatomic [place] Normal Female Rubella: 26.30 (10/15 1153) Varicella: Immune  GTT 84 RPR: Non Reactive (10/15 1153)   Rhogam  Not applicable HBsAg: Negative (10/15 1153)   TDaP vaccine  06/27/2017                      Flu Shot: 04/26/17 HIV: Non Reactive (10/15 1153)   Baby Food  Breast, given book                 GBS:   Contraception  BTL consent 06/27/2017 Pap: 05/19/2016 ASCUS HPV positive  CBB  Given information   CS/VBAC NA   Support Person Partner: [name]          History of gestational hypertension 11/28/2011 by [name], CNM No   Overview Addendum 03/01/2017  9:36 PM by Farrel Conners, CNM    24 hour protein 2032  on magnesium 24 hours pp With first pregnancy          Gestational age appropriate obstetric precautions including but not limited to vaginal bleeding, contractions, leaking of fluid and fetal movement were reviewed in detail with the patient.    Return in about 1 week (around 08/22/2017) for ROB.   Given information on cord blood banking. Given information on volunteer doula program at  the hospital.  Discussed breast feeding. Patient is planning on breast feeding. Patient was given resources and lactation consultation was advised.    Adelene Idler MD [facility], [facility] 08/15/17 11:42 AM

## 2017-08-15 NOTE — Patient Instructions (Signed)

## 2017-08-24 ENCOUNTER — Ambulatory Visit (INDEPENDENT_AMBULATORY_CARE_PROVIDER_SITE_OTHER): Payer: Medicaid Other | Admitting: Obstetrics and Gynecology

## 2017-08-24 VITALS — BP 98/58 | Wt 146.0 lb

## 2017-08-24 DIAGNOSIS — Z3A39 39 weeks gestation of pregnancy: Secondary | ICD-10-CM

## 2017-08-24 DIAGNOSIS — Z8759 Personal history of other complications of pregnancy, childbirth and the puerperium: Secondary | ICD-10-CM

## 2017-08-24 DIAGNOSIS — O99013 Anemia complicating pregnancy, third trimester: Secondary | ICD-10-CM

## 2017-08-24 DIAGNOSIS — O0993 Supervision of high risk pregnancy, unspecified, third trimester: Secondary | ICD-10-CM

## 2017-08-24 NOTE — Progress Notes (Signed)
Routine Prenatal Care Visit  Subjective  [name] [name] is a 22 y.o. G3P2002 at [redacted]w[redacted]d being seen today for ongoing prenatal care.  She is currently monitored for the following issues for this high-risk pregnancy and has History of gestational hypertension; Pregnancy, supervision, high-risk; Nausea and vomiting during pregnancy; Migraine; Anemia of mother in pregnancy; and Abdominal cramping affecting pregnancy on their problem list.  ----------------------------------------------------------------------------------- Patient reports backache.   Contractions: Irregular. Vag. Bleeding: None.  Movement: Present. Denies leaking of fluid.  ----------------------------------------------------------------------------------- [name], current medications, past family history, past medical history, past social history, past surgical history and problem list. Problem list updated.   Objective  Blood pressure (!) 98/58, weight 146 lb (66.2 kg), last menstrual period 10/30/2016, unknown if currently breastfeeding. Pregravid weight 103 lb (46.7 kg) Total Weight Gain 43 lb (19.5 kg) Urinalysis: Urine Protein: Negative Urine Glucose: Negative  Fetal Status: Fetal Heart Rate (bpm): 130 Fundal Height: 38 cm Movement: Present  Presentation: Vertex  General:  Alert, oriented and cooperative. Patient is in no acute distress.  Skin: Skin is warm and dry. No rash noted.   Cardiovascular: Normal heart rate noted  Respiratory: Normal respiratory effort, no problems with respiration noted  Abdomen: Soft, gravid, appropriate for gestational age. Pain/Pressure: Absent     Pelvic:  Cervical exam performed Dilation: 1 Effacement (%): 40 Station: -3  Extremities: Normal range of motion.  Edema: None  ental Status: Normal mood and affect. Normal behavior. Normal judgment and thought content.     Assessment   21 y.o. [id number]  at [redacted]w[redacted]d by  09/04/2017, by Ultrasound presenting for routine prenatal visit  Plan   pregnancy Problems (from 01/10/17 to present)    Problem Noted Resolved   Anemia of mother in pregnancy 06/17/2017 by Vena Austria, MD No   Overview Signed 06/17/2017 12:37 PM by Vena Austria, MD    Hgb 8.6 at 28 week labs despite already being on po iron, hematology referral placed      Nausea and vomiting during pregnancy 01/20/2017 by Oswaldo Conroy, CNM No   Pregnancy, supervision, high-risk 01/10/2017 by Tresea Mall, CNM No   Overview Addendum 08/15/2017 11:40 AM by Natale Milch, MD    [facility] Prenatal [facility]  Dating 7 week [place] Blood type: A/Positive/-- (10/15 1153)   Genetic Screen 1 Screen: Normal  AFP negative    Antibody:Negative (10/15 1153)  Anatomic [place] Normal Female Rubella: 26.30 (10/15 1153) Varicella: Immune  GTT 84 RPR: Non Reactive (10/15 1153)   Rhogam  Not applicable HBsAg: Negative (10/15 1153)   TDaP vaccine  06/27/2017                      Flu Shot: 04/26/17 HIV: Non Reactive (10/15 1153)   Baby Food  Breast, given book                 GBS:   Contraception  BTL consent 06/27/2017 Pap: 05/19/2016 ASCUS HPV positive  CBB  Given information   CS/VBAC NA   Support Person Partner: [name]          History of gestational hypertension 11/28/2011 by [name], CNM No   Overview Addendum 03/01/2017  9:36 PM by Farrel Conners, CNM    24 hour protein 2032 on magnesium 24 hours pp With first pregnancy          Gestational age appropriate obstetric precautions including  but not limited to vaginal bleeding, contractions, leaking of fluid and fetal movement were reviewed in detail with the patient.    Elective IOL scheduled for 09/01/17 at 8:00 am.  Membranes swept at patient's request.  Return in about 1 week (around 08/31/2017) for ROB.  Adelene Idler MD [facility], [facility] 08/24/17 9:33 AM

## 2017-08-24 NOTE — Progress Notes (Signed)
Pt c/o headaches and vaginal discharge. Intermittent ctx.

## 2017-08-25 ENCOUNTER — Encounter: Payer: Self-pay | Admitting: *Deleted

## 2017-08-25 ENCOUNTER — Other Ambulatory Visit: Payer: Medicaid Other

## 2017-08-25 ENCOUNTER — Inpatient Hospital Stay: Payer: Medicaid Other | Admitting: Registered Nurse

## 2017-08-25 ENCOUNTER — Ambulatory Visit: Payer: Medicaid Other | Admitting: Oncology

## 2017-08-25 ENCOUNTER — Ambulatory Visit: Payer: Medicaid Other

## 2017-08-25 ENCOUNTER — Inpatient Hospital Stay
Admission: EM | Admit: 2017-08-25 | Discharge: 2017-08-26 | DRG: 798 | Disposition: A | Discharge status: Home / Self Care | Payer: Medicaid Other | Attending: Obstetrics and Gynecology | Admitting: Obstetrics and Gynecology

## 2017-08-25 ENCOUNTER — Other Ambulatory Visit: Payer: Self-pay

## 2017-08-25 DIAGNOSIS — Z3483 Encounter for supervision of other normal pregnancy, third trimester: Secondary | ICD-10-CM | POA: Diagnosis present

## 2017-08-25 DIAGNOSIS — Z302 Encounter for sterilization: Secondary | ICD-10-CM

## 2017-08-25 DIAGNOSIS — D649 Anemia, unspecified: Secondary | ICD-10-CM | POA: Diagnosis present

## 2017-08-25 DIAGNOSIS — Z3A38 38 weeks gestation of pregnancy: Secondary | ICD-10-CM

## 2017-08-25 DIAGNOSIS — Z8759 Personal history of other complications of pregnancy, childbirth and the puerperium: Secondary | ICD-10-CM

## 2017-08-25 DIAGNOSIS — O0993 Supervision of high risk pregnancy, unspecified, third trimester: Secondary | ICD-10-CM

## 2017-08-25 DIAGNOSIS — O99013 Anemia complicating pregnancy, third trimester: Secondary | ICD-10-CM

## 2017-08-25 DIAGNOSIS — O9902 Anemia complicating childbirth: Principal | ICD-10-CM | POA: Diagnosis present

## 2017-08-25 DIAGNOSIS — O219 Vomiting of pregnancy, unspecified: Secondary | ICD-10-CM

## 2017-08-25 LAB — CBC
HCT: 36.1 % (ref 35.0–47.0)
HEMOGLOBIN: 12.2 g/dL (ref 12.0–16.0)
MCH: 30.1 pg (ref 26.0–34.0)
MCHC: 33.9 g/dL (ref 32.0–36.0)
MCV: 88.7 fL (ref 80.0–100.0)
PLATELETS: 263 10*3/uL (ref 150–440)
RBC: 4.07 MIL/uL (ref 3.80–5.20)
RDW: 19.5 % — ABNORMAL HIGH (ref 11.5–14.5)
WBC: 12 10*3/uL — AB (ref 3.6–11.0)

## 2017-08-25 LAB — TYPE AND SCREEN
ABO/RH(D): A POS
Antibody Screen: NEGATIVE

## 2017-08-25 LAB — CHLAMYDIA/NGC RT PCR (ARMC ONLY)
CHLAMYDIA TR: NOT DETECTED
N GONORRHOEAE: NOT DETECTED

## 2017-08-25 MED ORDER — LACTATED RINGERS IV SOLN: 500.0000 mL | INTRAVENOUS | Status: DC | PRN

## 2017-08-25 MED ORDER — AMMONIA AROMATIC IN INHA
RESPIRATORY_TRACT | Status: AC
  Filled 2017-08-25: qty 10

## 2017-08-25 MED ORDER — LIDOCAINE HCL (PF) 1 % IJ SOLN
30.0000 mL | INTRAMUSCULAR | Status: DC | PRN
  Filled 2017-08-25: qty 30

## 2017-08-25 MED ORDER — DIPHENHYDRAMINE HCL 50 MG/ML IJ SOLN: 12.5000 mg | INTRAMUSCULAR | Status: DC | PRN

## 2017-08-25 MED ORDER — WITCH HAZEL-GLYCERIN EX PADS: 1.0000 "application " | MEDICATED_PAD | CUTANEOUS | Status: DC | PRN

## 2017-08-25 MED ORDER — PHENYLEPHRINE 40 MCG/ML (10ML) SYRINGE FOR IV PUSH (FOR BLOOD PRESSURE SUPPORT)
80.0000 ug | PREFILLED_SYRINGE | INTRAVENOUS | Status: DC | PRN
  Filled 2017-08-25: qty 5

## 2017-08-25 MED ORDER — ACETAMINOPHEN 325 MG PO TABS: 650.0000 mg | ORAL_TABLET | ORAL | Status: DC | PRN

## 2017-08-25 MED ORDER — ONDANSETRON HCL 4 MG/2ML IJ SOLN
4.0000 mg | INTRAMUSCULAR | Status: DC | PRN
  Administered 2017-08-26: 4 mg via INTRAVENOUS

## 2017-08-25 MED ORDER — ONDANSETRON HCL 4 MG/2ML IJ SOLN: 4.0000 mg | Freq: Four times a day (QID) | INTRAMUSCULAR | Status: DC | PRN

## 2017-08-25 MED ORDER — LIDOCAINE HCL (PF) 1 % IJ SOLN
INTRAMUSCULAR | Status: DC | PRN
  Administered 2017-08-25: 3 mL via INTRADERMAL

## 2017-08-25 MED ORDER — PRENATAL MULTIVITAMIN CH: 1.0000 | ORAL_TABLET | Freq: Every day | ORAL | Status: DC

## 2017-08-25 MED ORDER — LACTATED RINGERS IV SOLN
INTRAVENOUS | Status: DC
  Administered 2017-08-25: 16:00:00 via INTRAVENOUS

## 2017-08-25 MED ORDER — ONDANSETRON HCL 4 MG PO TABS: 4.0000 mg | ORAL_TABLET | ORAL | Status: DC | PRN

## 2017-08-25 MED ORDER — LIDOCAINE-EPINEPHRINE (PF) 1.5 %-1:200000 IJ SOLN: INTRAMUSCULAR | Status: DC | PRN

## 2017-08-25 MED ORDER — SOD CITRATE-CITRIC ACID 500-334 MG/5ML PO SOLN: 30.0000 mL | ORAL | Status: DC | PRN

## 2017-08-25 MED ORDER — LIDOCAINE-EPINEPHRINE (PF) 1.5 %-1:200000 IJ SOLN
INTRAMUSCULAR | Status: DC | PRN
  Administered 2017-08-25: 3 mL via PERINEURAL

## 2017-08-25 MED ORDER — SIMETHICONE 80 MG PO CHEW: 80.0000 mg | CHEWABLE_TABLET | ORAL | Status: DC | PRN

## 2017-08-25 MED ORDER — FENTANYL 2.5 MCG/ML W/ROPIVACAINE 0.15% IN NS 100 ML EPIDURAL (ARMC)
EPIDURAL | Status: AC
  Filled 2017-08-25: qty 100

## 2017-08-25 MED ORDER — EPHEDRINE 5 MG/ML INJ
10.0000 mg | INTRAVENOUS | Status: DC | PRN
  Filled 2017-08-25: qty 2

## 2017-08-25 MED ORDER — DIBUCAINE 1 % RE OINT: 1.0000 "application " | TOPICAL_OINTMENT | RECTAL | Status: DC | PRN

## 2017-08-25 MED ORDER — FENTANYL 2.5 MCG/ML W/ROPIVACAINE 0.15% IN NS 100 ML EPIDURAL (ARMC): 12.0000 mL/h | EPIDURAL | Status: DC

## 2017-08-25 MED ORDER — FENTANYL 2.5 MCG/ML W/ROPIVACAINE 0.15% IN NS 100 ML EPIDURAL (ARMC)
EPIDURAL | Status: DC | PRN
  Administered 2017-08-25: 12 mL/h via EPIDURAL

## 2017-08-25 MED ORDER — BENZOCAINE-MENTHOL 20-0.5 % EX AERO: 1.0000 "application " | INHALATION_SPRAY | CUTANEOUS | Status: DC | PRN

## 2017-08-25 MED ORDER — OXYTOCIN 40 UNITS IN LACTATED RINGERS INFUSION - SIMPLE MED
2.5000 [IU]/h | INTRAVENOUS | Status: DC
  Filled 2017-08-25: qty 1000

## 2017-08-25 MED ORDER — OXYTOCIN BOLUS FROM INFUSION
500.0000 mL | Freq: Once | INTRAVENOUS | Status: AC
  Administered 2017-08-25: 500 mL via INTRAVENOUS

## 2017-08-25 MED ORDER — SENNOSIDES-DOCUSATE SODIUM 8.6-50 MG PO TABS: 2.0000 | ORAL_TABLET | ORAL | Status: DC

## 2017-08-25 MED ORDER — OXYCODONE-ACETAMINOPHEN 5-325 MG PO TABS: 2.0000 | ORAL_TABLET | ORAL | Status: DC | PRN

## 2017-08-25 MED ORDER — BUTORPHANOL TARTRATE 1 MG/ML IJ SOLN
1.0000 mg | INTRAMUSCULAR | Status: DC | PRN
Start: 2017-08-25 — End: 2017-08-25

## 2017-08-25 MED ORDER — DIPHENHYDRAMINE HCL 25 MG PO CAPS: 25.0000 mg | ORAL_CAPSULE | Freq: Four times a day (QID) | ORAL | Status: DC | PRN

## 2017-08-25 MED ORDER — COCONUT OIL OIL: 1.0000 "application " | TOPICAL_OIL | Status: DC | PRN

## 2017-08-25 MED ORDER — OXYCODONE-ACETAMINOPHEN 5-325 MG PO TABS
1.0000 | ORAL_TABLET | ORAL | Status: DC | PRN
  Administered 2017-08-26: 1 via ORAL
  Filled 2017-08-25: qty 1

## 2017-08-25 MED ORDER — IBUPROFEN 600 MG PO TABS
600.0000 mg | ORAL_TABLET | Freq: Four times a day (QID) | ORAL | Status: DC
  Administered 2017-08-26 (×3): 600 mg via ORAL
  Filled 2017-08-25 (×3): qty 1

## 2017-08-25 MED ORDER — MISOPROSTOL 200 MCG PO TABS
ORAL_TABLET | ORAL | Status: AC
  Filled 2017-08-25: qty 4

## 2017-08-25 MED ORDER — LACTATED RINGERS IV SOLN: 500.0000 mL | Freq: Once | INTRAVENOUS | Status: DC

## 2017-08-25 MED ORDER — LIDOCAINE HCL (CARDIAC) PF 100 MG/5ML IV SOSY
PREFILLED_SYRINGE | INTRAVENOUS | Status: DC | PRN
  Administered 2017-08-25: 140 mg via INTRAVENOUS

## 2017-08-25 NOTE — Anesthesia Procedure Notes (Signed)
Epidural Patient location during procedure: OB Start time: 08/25/2017 2:15 PM End time: 08/25/2017 2:30 PM  Staffing Anesthesiologist: Yves Dill, MD Resident/CRNA: Irving Burton, CRNA Performed: resident/CRNA   Preanesthetic Checklist Completed: patient identified, site marked, surgical consent, pre-op evaluation, timeout performed, IV checked, risks and benefits discussed and monitors and equipment checked  Epidural Patient position: sitting Prep: ChloraPrep Patient monitoring: heart rate, continuous pulse ox and blood pressure Approach: midline Location: L3-L4 Injection technique: LOR saline  Needle:  Needle type: Tuohy  Needle gauge: 17 G Needle length: 9 cm and 9 Needle insertion depth: 7 cm Catheter type: closed end flexible Catheter size: 19 Gauge Catheter at skin depth: 12 cm Test dose: negative and 1.5% lidocaine with Epi 1:200 K  Assessment Events: blood not aspirated, injection not painful, no injection resistance, negative IV test and no paresthesia  Additional Notes 1 attempt Pt. Evaluated and documentation done after procedure finished. Patient identified. Risks/Benefits/Options discussed with patient including but not limited to bleeding, infection, nerve damage, paralysis, failed block, incomplete pain control, headache, blood pressure changes, nausea, vomiting, reactions to medication both or allergic, itching and postpartum back pain. Confirmed with bedside nurse the patient's most recent platelet count. Confirmed with patient that they are not currently taking any anticoagulation, have any bleeding history or any family history of bleeding disorders. Patient expressed understanding and wished to proceed. All questions were answered. Sterile technique was used throughout the entire procedure. Please see nursing notes for vital signs. Test dose was given through epidural catheter and negative prior to continuing to dose epidural or start infusion. Warning  signs of high block given to the patient including shortness of breath, tingling/numbness in hands, complete motor block, or any concerning symptoms with instructions to call for help. Patient was given instructions on fall risk and not to get out of bed. All questions and concerns addressed with instructions to call with any issues or inadequate analgesia.   Patient tolerated the insertion well without immediate complications.Reason for block:procedure for pain

## 2017-08-25 NOTE — H&P (Signed)
Obstetric H&P   Chief Complaint: Ruptured membranes   Prenatal Care Provider: [facility]  History of Present Illness: 22 y.o. [id number] [redacted]w[redacted]d by 09/04/2017, by Ultrasound presenting to L&D with contractions and rupture of membranes, clear just prior to presentation.  +FM, no vb.  Pregnancy thus far uncomplicated other than anemia, H&H 9.7 and 25.7 on 06/30/17.  Pregnancy history notable for a history  Of preeclampsia in G1 pregnancy.    Pregravid weight 103 lb (46.7 kg) Total Weight Gain 43 lb (19.5 kg)  pregnancy Problems (from 01/10/17 to present)    Problem Noted Resolved   Anemia of mother in pregnancy 06/17/2017 by Vena Austria, MD No   Overview Signed 06/17/2017 12:37 PM by Vena Austria, MD    Hgb 8.6 at 28 week labs despite already being on po iron, hematology referral placed      Nausea and vomiting during pregnancy 01/20/2017 by Oswaldo Conroy, CNM No   Pregnancy, supervision, high-risk 01/10/2017 by Tresea Mall, CNM No   Overview Addendum 08/15/2017 11:40 AM by Natale Milch, MD    Clinic [facility] Prenatal Labs  Dating 7 week [place] Blood type: A/Positive/-- (10/15 1153)   Genetic Screen 1 Screen: Normal  AFP negative    Antibody:Negative (10/15 1153)  Anatomic [place] Normal Female Rubella: 26.30 (10/15 1153) Varicella: Immune  GTT 84 RPR: Non Reactive (10/15 1153)   Rhogam  Not applicable HBsAg: Negative (10/15 1153)   TDaP vaccine  06/27/2017                      Flu Shot: 04/26/17 HIV: Non Reactive (10/15 1153)   Baby Food  Breast, given book                 GBS: Negative  Contraception  BTL consent 06/27/2017 Pap: 05/19/2016 ASCUS HPV positive  CBB  Given information   CS/VBAC NA   Support Person Partner: [name]          History of gestational hypertension 11/28/2011 by [name], CNM No   Overview Addendum 03/01/2017  9:36 PM by Farrel Conners, CNM    24 hour protein 2032 on magnesium 24 hours pp With first pregnancy          Review of Systems: 10  point review of systems negative unless otherwise noted in HPI  Past Medical History: Past Medical History:  Diagnosis Date  . Anemia   . Breast mass   . H/O rubella   . H/O varicella   . Hx of ovarian cyst   . Preeclampsia, severe    with 1st pregnancy  . Yeast infection     Past Surgical History: Past Surgical History:  Procedure Laterality Date  . AUGMENTATION MAMMAPLASTY    . NO PAST SURGERIES      Past Obstetric History: #: 1, Date: 11/27/11, Sex: Female, Weight: 8 lb 11.9 oz (3.966 kg), GA: [redacted]w[redacted]d, Delivery: Vaginal, Spontaneous, Apgar1: 7, Apgar5: 9, Living: Living, Birth Comments: None  #: 2, Date: 03/20/16, Sex: Female, Weight: 7 lb 8 oz (3.402 kg), GA: [redacted]w[redacted]d, Delivery: Vaginal, Spontaneous, Apgar1: None, Apgar5: None, Living: Living, Birth Comments: None  #: 3, Date: None, Sex: None, Weight: None, GA: None, Delivery: None, Apgar1: None, Apgar5: None, Living: None, Birth Comments: None   Past Gynecologic History:  Family History: Family History  Problem Relation Age of Onset  . Alcohol abuse Mother   . Depression Mother   . Alcohol abuse Father   . Diabetes Maternal  Grandmother   . Breast cancer Maternal Grandmother   . Mental retardation Cousin   . Autism Cousin   . Breast cancer Maternal Aunt     Social History: Social History   Socioeconomic History  . Marital status: Legally Separated    Spouse name: Not on file  . Number of children: 2  . Years of education: Not on file  . Highest education level: Not on file  Occupational History  . Not on file  Social Needs  . Financial resource strain: Not on file  . Food insecurity:    Worry: Not on file    Inability: Not on file  . Transportation needs:    Medical: Not on file    Non-medical: Not on file  Tobacco Use  . Smoking status: Never Smoker  . Smokeless tobacco: Never Used  Substance and Sexual Activity  . Alcohol use: No  . Drug use: No  . Sexual activity: Yes    Birth control/protection:  Surgical    Comment: currently pregnant  Lifestyle  . Physical activity:    Days per week: Not on file    Minutes per session: Not on file  . Stress: Not on file  Relationships  . Social connections:    Talks on phone: Not on file    Gets together: Not on file    Attends religious service: Not on file    Active member of club or organization: Not on file    Attends meetings of clubs or organizations: Not on file    Relationship status: Not on file  . Intimate partner violence:    Fear of current or ex partner: Not on file    Emotionally abused: Not on file    Physically abused: Not on file    Forced sexual activity: Not on file  Other Topics Concern  . Not on file  Social History Narrative  . Not on file    Medications: Prior to Admission medications   Medication Sig Start Date End Date Taking? Authorizing Provider  ondansetron (ZOFRAN ODT) 4 MG disintegrating tablet Take 1 tablet (4 mg total) by mouth every 6 (six) hours as needed for nausea. 07/25/17  Yes Oswaldo Conroy, CNM  Prenatal Vit-Fe Fumarate-FA (PRENATAL VITAMIN PO) Take by mouth.   Yes [provider]    Allergies: Allergies  Allergen Reactions  . Penicillins Hives    Physical Exam: Vitals: Last menstrual period 10/30/2016, unknown if currently breastfeeding.   General: NAD HEENT: normocephalic, anicteric Pulmonary: No increased work of breathing Cardiovascular: RRR, distal pulses 2+ Abdomen: Gravid, non-tender Genitourinary: 1cm Extremities: no edema, erythema, or tenderness Neurologic: Grossly intact Psychiatric: mood appropriate, affect full  Labs: No results found for this or any previous visit (from the past 24 hour(s)).  Assessment: 23 y.o. Z6X0960 [redacted]w[redacted]d by 09/04/2017, by Ultrasound presenting in term labor with SROM  Plan: 1) Labor - expectant management  2) Fetus - cat I  3) PNL - Blood type A/Positive/-- (10/15 1153) / Anti-bodyscreen Negative (10/15 1153) / Rubella 26.30  (10/15 1153) / Varicella Immune / RPR Non Reactive (03/18 4540) / HBsAg Negative (10/15 1153) / HIV Non Reactive (03/18 0822) / 1-hr OGTT 84 / GBS  negative  4) Immunization History -  Immunization History  Administered Date(s) Administered  . Influenza,inj,Quad PF,6+ Mos 04/26/2017  . Tdap 06/27/2017    5) Disposition - pending delivery  Vena Austria, MD, [facility], [facility] 08/25/2017, 3:01 PM

## 2017-08-25 NOTE — Anesthesia Preprocedure Evaluation (Signed)
Anesthesia Evaluation    Reviewed: Allergy & Precautions, H&P , Patient's Chart, lab work & pertinent test results  Airway Mallampati: II  TM Distance: >3 FB Neck ROM: full    Dental  (+) Teeth Intact   Pulmonary           Cardiovascular hypertension (gestational diabetes with previous pregnancy),      Neuro/Psych  Headaches,    GI/Hepatic   Endo/Other    Renal/GU      Musculoskeletal   Abdominal   Peds  Hematology  (+) anemia ,   Anesthesia Other Findings   Reproductive/Obstetrics                             Anesthesia Physical Anesthesia Plan  ASA: II  Anesthesia Plan: Epidural   Post-op Pain Management:    Induction:   PONV Risk Score and Plan:   Airway Management Planned:   Additional Equipment:   Intra-op Plan:   Post-operative Plan:   Informed Consent: I have reviewed the patients History and Physical, chart, labs and discussed the procedure including the risks, benefits and alternatives for the proposed anesthesia with the patient or authorized representative who has indicated his/her understanding and acceptance.     Plan Discussed with: Anesthesiologist and CRNA  Anesthesia Plan Comments:         Anesthesia Quick Evaluation

## 2017-08-25 NOTE — Discharge Summary (Signed)
Obstetric Discharge Summary Reason for Admission: onset of labor Prenatal Procedures: none Intrapartum Procedures: spontaneous vaginal delivery Postpartum Procedures: P.P. tubal ligation on 08/26/17 by Dr. Jean Rosenthal Complications-Operative and Postpartum: none Hemoglobin  Date Value Ref Range Status  08/26/2017 10.1 (L) 12.0 - 16.0 g/dL Final  13/10/6576 8.6 (L) 11.1 - 15.9 g/dL Final   HCT  Date Value Ref Range Status  08/26/2017 30.2 (L) 35.0 - 47.0 % Final   Hematocrit  Date Value Ref Range Status  06/13/2017 27.0 (L) 34.0 - 46.6 % Final    Physical Exam:  General: alert, cooperative and no distress Lochia: appropriate Uterine Fundus: firm Incision: no significant drainage DVT Evaluation: No evidence of DVT seen on physical exam.  Discharge Diagnoses: Term Pregnancy-delivered and postpartum tubal ligation  Discharge Information: Date: 08/26/2017 Activity: pelvic rest Diet: routine Allergies as of 08/26/2017      Reactions   Penicillins Hives      Medication List    STOP taking these medications   ondansetron 4 MG disintegrating tablet Commonly known as:  ZOFRAN ODT     TAKE these medications   ibuprofen 600 MG tablet Commonly known as:  ADVIL,MOTRIN Take 1 tablet (600 mg total) by mouth every 6 (six) hours. Start taking on:  08/27/2017   oxyCODONE-acetaminophen 5-325 MG tablet Commonly known as:  PERCOCET/ROXICET Take 1 tablet by mouth every 6 (six) hours as needed for up to 3 days for severe pain (pain scale 4-7).   PRENATAL VITAMIN PO Take by mouth.       Condition: stable Instructions: reviewed postpartum instructions, refer to AVS for further instructions Discharge to: home Follow-up Information    Vena Austria, MD Follow up in 6 week(s).   Specialty:  Obstetrics and Gynecology Contact information: [address] [place] [address] [phone]           Newborn Data: Live born female  Birth Weight: 7 lb 3 oz (3260 g) APGAR:  8, 9  Newborn Delivery   Birth date/time:  08/25/2017 16:51:00 Delivery type:  Vaginal, Spontaneous     Home with mother.  Tresea Mall, CNM 08/26/2017, 7:54 PM

## 2017-08-26 ENCOUNTER — Inpatient Hospital Stay: Payer: Medicaid Other | Admitting: Anesthesiology

## 2017-08-26 ENCOUNTER — Encounter: Payer: Self-pay | Admitting: Anesthesiology

## 2017-08-26 ENCOUNTER — Encounter
Admission: EM | Disposition: A | Discharge status: Home / Self Care | Payer: Self-pay | Source: Home / Self Care | Attending: Obstetrics and Gynecology

## 2017-08-26 DIAGNOSIS — Z302 Encounter for sterilization: Secondary | ICD-10-CM

## 2017-08-26 HISTORY — PX: TUBAL LIGATION: SHX77

## 2017-08-26 LAB — CBC
HCT: 30.2 % — ABNORMAL LOW (ref 35.0–47.0)
Hemoglobin: 10.1 g/dL — ABNORMAL LOW (ref 12.0–16.0)
MCH: 30.2 pg (ref 26.0–34.0)
MCHC: 33.5 g/dL (ref 32.0–36.0)
MCV: 90.2 fL (ref 80.0–100.0)
Platelets: 208 10*3/uL (ref 150–440)
RBC: 3.35 MIL/uL — ABNORMAL LOW (ref 3.80–5.20)
RDW: 19.7 % — AB (ref 11.5–14.5)
WBC: 12.9 10*3/uL — AB (ref 3.6–11.0)

## 2017-08-26 LAB — RPR: RPR Ser Ql: NONREACTIVE

## 2017-08-26 SURGERY — LIGATION, FALLOPIAN TUBE, POSTPARTUM
Anesthesia: General | Site: Abdomen | Laterality: Bilateral | Wound class: Clean

## 2017-08-26 MED ORDER — SUCCINYLCHOLINE CHLORIDE 20 MG/ML IJ SOLN
INTRAMUSCULAR | Status: DC | PRN
  Administered 2017-08-26: 100 mg via INTRAVENOUS

## 2017-08-26 MED ORDER — PROPOFOL 10 MG/ML IV BOLUS
INTRAVENOUS | Status: DC | PRN
  Administered 2017-08-26: 150 mg via INTRAVENOUS

## 2017-08-26 MED ORDER — FENTANYL CITRATE (PF) 100 MCG/2ML IJ SOLN
INTRAMUSCULAR | Status: AC
  Administered 2017-08-26: 25 ug via INTRAVENOUS
  Filled 2017-08-26: qty 2

## 2017-08-26 MED ORDER — BUPIVACAINE HCL (PF) 0.25 % IJ SOLN
INTRAMUSCULAR | Status: DC | PRN
  Administered 2017-08-26: 3 mL

## 2017-08-26 MED ORDER — DEXAMETHASONE SODIUM PHOSPHATE 10 MG/ML IJ SOLN
INTRAMUSCULAR | Status: DC | PRN
  Administered 2017-08-26: 6 mg via INTRAVENOUS

## 2017-08-26 MED ORDER — LIDOCAINE HCL (CARDIAC) PF 100 MG/5ML IV SOSY
PREFILLED_SYRINGE | INTRAVENOUS | Status: DC | PRN
  Administered 2017-08-26: 100 mg via INTRAVENOUS

## 2017-08-26 MED ORDER — LACTATED RINGERS IV SOLN
INTRAVENOUS | Status: DC
  Administered 2017-08-26: 09:00:00 via INTRAVENOUS

## 2017-08-26 MED ORDER — OXYCODONE-ACETAMINOPHEN 5-325 MG PO TABS: 1.0000 | ORAL_TABLET | Freq: Four times a day (QID) | ORAL | 0 refills | Status: AC | PRN

## 2017-08-26 MED ORDER — FENTANYL CITRATE (PF) 100 MCG/2ML IJ SOLN
25.0000 ug | INTRAMUSCULAR | Status: DC | PRN
  Administered 2017-08-26 (×4): 25 ug via INTRAVENOUS

## 2017-08-26 MED ORDER — FENTANYL CITRATE (PF) 100 MCG/2ML IJ SOLN
INTRAMUSCULAR | Status: DC | PRN
  Administered 2017-08-26 (×2): 50 ug via INTRAVENOUS

## 2017-08-26 MED ORDER — IBUPROFEN 600 MG PO TABS: 600.0000 mg | ORAL_TABLET | Freq: Four times a day (QID) | ORAL | 0 refills | Status: AC

## 2017-08-26 MED ORDER — MIDAZOLAM HCL 2 MG/2ML IJ SOLN
INTRAMUSCULAR | Status: DC | PRN
  Administered 2017-08-26: 2 mg via INTRAVENOUS

## 2017-08-26 MED ORDER — ONDANSETRON HCL 4 MG/2ML IJ SOLN: 4.0000 mg | Freq: Once | INTRAMUSCULAR | Status: DC | PRN

## 2017-08-26 MED ORDER — ROCURONIUM BROMIDE 100 MG/10ML IV SOLN
INTRAVENOUS | Status: DC | PRN
  Administered 2017-08-26: 10 mg via INTRAVENOUS

## 2017-08-26 SURGICAL SUPPLY — 28 items
BLADE SURG SZ11 CARB STEEL (BLADE) ×3 IMPLANT
CHLORAPREP W/TINT 26ML (MISCELLANEOUS) ×3 IMPLANT
DERMABOND ADVANCED (GAUZE/BANDAGES/DRESSINGS) ×2
DERMABOND ADVANCED .7 DNX12 (GAUZE/BANDAGES/DRESSINGS) ×1 IMPLANT
DRAPE LAPAROTOMY 77X122 PED (DRAPES) ×3 IMPLANT
ELECT CAUTERY BLADE 6.4 (BLADE) ×3 IMPLANT
ELECT REM PT RETURN 9FT ADLT (ELECTROSURGICAL) ×3
ELECTRODE REM PT RTRN 9FT ADLT (ELECTROSURGICAL) ×1 IMPLANT
GLOVE BIO SURGEON STRL SZ7 (GLOVE) ×6 IMPLANT
GLOVE BIOGEL PI IND STRL 7.5 (GLOVE) ×1 IMPLANT
GLOVE BIOGEL PI INDICATOR 7.5 (GLOVE) ×2
GOWN STRL REUS W/ TWL LRG LVL3 (GOWN DISPOSABLE) ×2 IMPLANT
GOWN STRL REUS W/ TWL XL LVL3 (GOWN DISPOSABLE) ×2 IMPLANT
GOWN STRL REUS W/TWL LRG LVL3 (GOWN DISPOSABLE) ×4
GOWN STRL REUS W/TWL XL LVL3 (GOWN DISPOSABLE) ×4
KIT TURNOVER CYSTO (KITS) ×3 IMPLANT
LABEL OR SOLS (LABEL) ×3 IMPLANT
NEEDLE HYPO 22GX1.5 SAFETY (NEEDLE) ×3 IMPLANT
NS IRRIG 500ML POUR BTL (IV SOLUTION) ×3 IMPLANT
PACK BASIN MINOR ARMC (MISCELLANEOUS) ×3 IMPLANT
SUT MNCRL 4-0 (SUTURE) ×2
SUT MNCRL 4-0 27XMFL (SUTURE) ×1
SUT PLAIN GUT 0 (SUTURE) ×6 IMPLANT
SUT VIC AB 2-0 UR6 27 (SUTURE) ×6 IMPLANT
SUT VIC AB 3-0 SH 27 (SUTURE) ×2
SUT VIC AB 3-0 SH 27X BRD (SUTURE) ×1 IMPLANT
SUTURE MNCRL 4-0 27XMF (SUTURE) ×1 IMPLANT
SYR 10ML LL (SYRINGE) ×3 IMPLANT

## 2017-08-26 NOTE — Progress Notes (Signed)
Discharged home with infant

## 2017-08-26 NOTE — Anesthesia Preprocedure Evaluation (Signed)
Anesthesia Evaluation  Patient identified by MRN, date of birth, ID band Patient awake    Reviewed: Allergy & Precautions, NPO status , Patient's Chart, lab work & pertinent test results, reviewed documented beta blocker date and time   Airway Mallampati: II  TM Distance: >3 FB     Dental  (+) Chipped   Pulmonary           Cardiovascular hypertension, Pt. on medications      Neuro/Psych  Headaches,    GI/Hepatic   Endo/Other    Renal/GU      Musculoskeletal   Abdominal   Peds  Hematology  (+) anemia ,   Anesthesia Other Findings   Reproductive/Obstetrics                             Anesthesia Physical Anesthesia Plan  ASA: II  Anesthesia Plan: General   Post-op Pain Management:    Induction: Intravenous  PONV Risk Score and Plan:   Airway Management Planned: Oral ETT  Additional Equipment:   Intra-op Plan:   Post-operative Plan:   Informed Consent: I have reviewed the patients History and Physical, chart, labs and discussed the procedure including the risks, benefits and alternatives for the proposed anesthesia with the patient or authorized representative who has indicated his/her understanding and acceptance.     Plan Discussed with: CRNA  Anesthesia Plan Comments:         Anesthesia Quick Evaluation

## 2017-08-26 NOTE — Anesthesia Post-op Follow-up Note (Signed)
Anesthesia QCDR form completed.        

## 2017-08-26 NOTE — Progress Notes (Signed)
Post Partum Day 1 Subjective: Doing well, no complaints.  Tolerating regular diet, pain with PO meds, voiding and ambulating without difficulty. She has been NPO since midnight awaiting tubal ligation procedure. She is requesting discharge later today if all is well following procedure.  No CP SOB F/C N/V or leg pain No HA, change of vision, RUQ/epigastric pain  Objective: BP (!) 93/49 (BP Location: Right Arm)   Pulse 66   Temp 97.9 F (36.6 C) (Oral)   Resp 20   Ht 5\' 1"  (1.549 m)   Wt 146 lb (66.2 kg)   LMP 10/30/2016   SpO2 99%    BMI 27.59 kg/m    Physical Exam:  General: NAD CV: RRR Pulm: nl effort, CTABL Lochia: moderate Uterine Fundus: fundus firm and below umbilicus DVT Evaluation: no cords, ttp LEs   Recent Labs    08/25/17 1544 08/26/17 0708  HGB 12.2 10.1*  HCT 36.1 30.2*  WBC 12.0* 12.9*  PLT 263 208    Assessment/Plan: 22 y.o. G3P3003 postpartum day # 1  1. Continue routine postpartum care 2. BTL today at noon 3. A positive, Rubella Immune, Varicella Immune 4. TDAP given antepartum 5. Formula feeding/Contraception: BTL 6. Disposition: possible discharge to home later today    Tresea MallJane Latera Mclin, CNM

## 2017-08-26 NOTE — Anesthesia Postprocedure Evaluation (Signed)
Anesthesia Post Note  Patient: [name] SorensonSandra [name]  Procedure(s) Performed: POST PARTUM TUBAL LIGATION (Bilateral Abdomen)  Patient location during evaluation: PACU Anesthesia Type: General Level of consciousness: awake and alert Pain management: pain level controlled Vital Signs Assessment: post-procedure vital signs reviewed and stable Respiratory status: spontaneous breathing, nonlabored ventilation, respiratory function stable and patient connected to nasal cannula oxygen Cardiovascular status: blood pressure returned to baseline and stable Postop Assessment: no apparent nausea or vomiting Anesthetic complications: no     Last Vitals:  Vitals:   08/26/17 1353 08/26/17 1358  BP:  110/68  Pulse: 82 74  Resp: 14 15  Temp:  36.9 C  SpO2: 100% 100%    Last Pain:  Vitals:   08/26/17 1358  TempSrc:   PainSc: 4                  Cleda MccreedyJoseph K Piscitello

## 2017-08-26 NOTE — Progress Notes (Signed)
Patient back to room from OR.

## 2017-08-26 NOTE — Op Note (Signed)
Operative Report  Pre-Op Diagnosis: multiparity, desires permanent sterilization  Post-Op Diagnosis: multiparity, desires permanent sterilization  Procedures:  Postpartum tubal ligation via Pomeroy method  Primary Surgeon: Thomasene MohairStephen Merie Wulf, MD  EBL: 20 ml   IVF: 700 mL   Specimens: portion of right and left fallopian tubes  Drains: None  Complications: None   Disposition: PACU   Condition: Stable   Findings: normal appearing bilateral fallopian tubes  Indication: The patient is a 22 y.o. Z6X0960G3P3003 who is postpartum day 1 status post spontaneous vaginal delivery.  She has been counseled extensively regarding risks, benefits, and alternatives to tubal ligation, including non-permanent forms of contraception that are equivalent in efficacy with potentially better side effects.  She has been advised that there is a failure rate of 3-5 in every 1,000 tubal ligations per year with an increased risk of ectopic pregnancy should pregnancy occur.   Procedure Summary:  The patient was taken to the operating room where general anesthesia was administered and found to be adequate. After timeout was called a small transverse, infraumbilical incision was made with the scalpel. The incision was carried down through the fascia until the peritoneum was identified and entered. The peritoneum was noted to be free of any adhesions and the incision was then extended. The fascia was tagged with a 2-0 vicryl on a UR-6 at the left apex.   The patient's left fallopian tube was identified, brought to the incision, and grasped with a Babcock clamp. The tube was then followed out to the fimbria. The Babcock clamp was then used to grasp the tube approximately 4 cm from the cornual region. A 3 cm segment of tube was then ligated with the 2 free ties of plain gut, and excised. Good hemostasis was noted and the tube was returned to the abdomen. The right fallopian tube was then ligated, and a 3 cm segment excised in a  similar fashion. Excellent hemostasis was noted, and the tube returned to the abdomen.  The peritoneum and fascia were closed in a single layer using 2-0 Vicryl.  A running 3-0 Vicryl was used to re-approximate the dead space at the subcutaneous level in a running fashion.  The skin was closed in a subcuticular fashion using 4-0 vicryl, undyed. The closure was also closed with Dermabond.  Sponge, lap, needle, and instrument counts were correct x 2.  VTE prophylaxis: SCDs. Antibiotic prophylaxis: none indicated. The patient tolerated the procedure well and was taken to the PACU in stable condition.   Thomasene MohairStephen Kelvon Giannini, MD 08/26/2017 1:17 PM

## 2017-08-26 NOTE — Progress Notes (Signed)
22 y.o. [id number]  with undesired fertility, desires permanent sterilization.  Other reversible forms of contraception were discussed with patient; she declines all other modalities. Permanent nature of as well as associated risks of the procedure discussed with patient including but not limited to: risk of regret, permanence of method, bleeding, infection, injury to surrounding organs and need for additional procedures.  Failure risk of 0.3-0.5% with increased risk of ectopic gestation if pregnancy occurs was also discussed with patient.  She wishes to proceed.  Thomasene MohairStephen Liller Yohn, MD, [facility]AC[facility], [facility] 08/26/2017 12:28 PM

## 2017-08-26 NOTE — Progress Notes (Signed)
Patient to OR for BTL 

## 2017-08-26 NOTE — Anesthesia Postprocedure Evaluation (Signed)
Anesthesia Post Note  Patient: [name] SorensonSandra Hannan  Procedure(s) Performed: AN AD HOC LABOR EPIDURAL  Patient location during evaluation: Mother Baby Anesthesia Type: Epidural Level of consciousness: awake, awake and alert and oriented Pain management: pain level controlled Vital Signs Assessment: post-procedure vital signs reviewed and stable Respiratory status: spontaneous breathing, nonlabored ventilation and respiratory function stable Cardiovascular status: blood pressure returned to baseline and stable Postop Assessment: no headache and no backache Anesthetic complications: no Comments: Pt states baby came to fast and epidural didn't have time to start working.     Last Vitals:  Vitals:   08/26/17 0000 08/26/17 0442  BP: 115/74 (!) 91/48  Pulse: 83 79  Resp: 20 20  Temp: 36.7 C 36.8 C  SpO2: 100% 100%    Last Pain:  Vitals:   08/26/17 0442  TempSrc: Oral  PainSc:                  Ginger CarneStephanie Elyanna Wallick

## 2017-08-26 NOTE — Transfer of Care (Signed)
Immediate Anesthesia Transfer of Care Note  Patient: [name] SorensonSandra Wehling  Procedure(s) Performed: POST PARTUM TUBAL LIGATION (Bilateral Abdomen)  Patient Location: PACU  Anesthesia Type:General  Level of Consciousness: awake, alert  and oriented  Airway & Oxygen Therapy: Patient Spontanous Breathing and Patient connected to face mask oxygen  Post-op Assessment: Report given to RN and Post -op Vital signs reviewed and stable  Post vital signs: Reviewed and stable  Last Vitals:  Vitals Value Taken Time  BP 116/71 08/26/2017  1:28 PM  Temp    Pulse 88 08/26/2017  1:28 PM  Resp 16 08/26/2017  1:28 PM  SpO2 100 % 08/26/2017  1:28 PM  Vitals shown include unvalidated device data.  Last Pain:  Vitals:   08/26/17 1221  TempSrc: Temporal  PainSc: 0-No pain         Complications: No apparent anesthesia complications

## 2017-08-26 NOTE — Anesthesia Procedure Notes (Signed)
Procedure Name: Intubation Date/Time: 08/26/2017 12:37 PM Performed by: Philbert Riser, CRNA Pre-anesthesia Checklist: Patient identified, Emergency Drugs available, Suction available, Patient being monitored and Timeout performed Patient Re-evaluated:Patient Re-evaluated prior to induction Oxygen Delivery Method: Circle system utilized and Simple face mask Preoxygenation: Pre-oxygenation with 100% oxygen Induction Type: IV induction Ventilation: Mask ventilation without difficulty Laryngoscope Size: Mac and 3 Grade View: Grade III Tube type: Oral Tube size: 7.0 mm Number of attempts: 1 Airway Equipment and Method: Stylet Placement Confirmation: ETT inserted through vocal cords under direct vision,  positive ETCO2 and breath sounds checked- equal and bilateral Secured at: 22 cm Tube secured with: Tape Dental Injury: Teeth and Oropharynx as per pre-operative assessment

## 2017-08-27 ENCOUNTER — Encounter: Payer: Self-pay | Admitting: Obstetrics and Gynecology

## 2017-08-29 LAB — SURGICAL PATHOLOGY

## 2017-08-31 ENCOUNTER — Encounter: Payer: Medicaid Other | Admitting: Advanced Practice Midwife

## 2017-10-20 ENCOUNTER — Ambulatory Visit: Payer: Medicaid Other | Admitting: Obstetrics & Gynecology

## 2017-11-30 ENCOUNTER — Emergency Department
Admission: EM | Admit: 2017-11-30 | Discharge: 2017-11-30 | Disposition: A | Discharge status: Home / Self Care | Payer: Medicaid Other | Attending: Emergency Medicine | Admitting: Emergency Medicine

## 2017-11-30 ENCOUNTER — Other Ambulatory Visit: Payer: Self-pay

## 2017-11-30 ENCOUNTER — Encounter: Payer: Self-pay | Admitting: Emergency Medicine

## 2017-11-30 ENCOUNTER — Emergency Department: Payer: Medicaid Other

## 2017-11-30 DIAGNOSIS — R102 Pelvic and perineal pain: Secondary | ICD-10-CM | POA: Diagnosis not present

## 2017-11-30 DIAGNOSIS — N83202 Unspecified ovarian cyst, left side: Secondary | ICD-10-CM | POA: Diagnosis not present

## 2017-11-30 DIAGNOSIS — R1032 Left lower quadrant pain: Secondary | ICD-10-CM | POA: Diagnosis present

## 2017-11-30 LAB — URINALYSIS, COMPLETE (UACMP) WITH MICROSCOPIC
BACTERIA UA: NONE SEEN
Bilirubin Urine: NEGATIVE
Glucose, UA: NEGATIVE mg/dL
Hgb urine dipstick: NEGATIVE
KETONES UR: NEGATIVE mg/dL
LEUKOCYTES UA: NEGATIVE
Nitrite: NEGATIVE
Protein, ur: NEGATIVE mg/dL
Specific Gravity, Urine: 1.015 (ref 1.005–1.030)
pH: 5 (ref 5.0–8.0)

## 2017-11-30 LAB — CBC
HEMATOCRIT: 34.4 % — AB (ref 35.0–47.0)
HEMOGLOBIN: 12.3 g/dL (ref 12.0–16.0)
MCH: 30.9 pg (ref 26.0–34.0)
MCHC: 35.7 g/dL (ref 32.0–36.0)
MCV: 86.6 fL (ref 80.0–100.0)
Platelets: 246 10*3/uL (ref 150–440)
RBC: 3.98 MIL/uL (ref 3.80–5.20)
RDW: 13.2 % (ref 11.5–14.5)
WBC: 9.5 10*3/uL (ref 3.6–11.0)

## 2017-11-30 LAB — POCT PREGNANCY, URINE: PREG TEST UR: NEGATIVE

## 2017-11-30 LAB — COMPREHENSIVE METABOLIC PANEL
ALBUMIN: 4.5 g/dL (ref 3.5–5.0)
ALT: 24 U/L (ref 0–44)
ANION GAP: 8 (ref 5–15)
AST: 17 U/L (ref 15–41)
Alkaline Phosphatase: 49 U/L (ref 38–126)
BILIRUBIN TOTAL: 0.7 mg/dL (ref 0.3–1.2)
BUN: 16 mg/dL (ref 6–20)
CALCIUM: 9.2 mg/dL (ref 8.9–10.3)
CO2: 24 mmol/L (ref 22–32)
Chloride: 109 mmol/L (ref 98–111)
Creatinine, Ser: 0.76 mg/dL (ref 0.44–1.00)
GFR calc non Af Amer: >60 mL/min (ref >60)
GLUCOSE: 103 mg/dL — AB (ref 70–99)
POTASSIUM: 3.5 mmol/L (ref 3.5–5.1)
SODIUM: 141 mmol/L (ref 135–145)
TOTAL PROTEIN: 7.8 g/dL (ref 6.5–8.1)

## 2017-11-30 LAB — WET PREP, GENITAL
CLUE CELLS WET PREP: NONE SEEN
SPERM: NONE SEEN
Trich, Wet Prep: NONE SEEN
Yeast Wet Prep HPF POC: NONE SEEN

## 2017-11-30 LAB — CHLAMYDIA/NGC RT PCR (ARMC ONLY)
Chlamydia Tr: NOT DETECTED
N GONORRHOEAE: NOT DETECTED

## 2017-11-30 LAB — LIPASE, BLOOD: Lipase: 29 U/L (ref 11–51)

## 2017-11-30 NOTE — Discharge Instructions (Signed)
You have been seen in the Emergency Department (ED) for abdominal pain.  We believe your pain is mostly likely caused by a ruptured ovarian cyst, which is generally a benign but potentially painful process.  Please read through the included information and follow up as instructed above regarding today?s emergent visit and the symptoms that are bothering you.  Take over-the-counter ibuprofen and Tylenol as needed for pain control (unless your doctor has told you in the past to avoid either of these medications). ° °Return to the ED if your abdominal pain worsens or fails to improve, you develop bloody vomiting, bloody diarrhea, you are unable to tolerate fluids due to vomiting, fever greater than 101, or other symptoms that concern you. ° °

## 2017-11-30 NOTE — ED Notes (Signed)
Patient transported to Ultrasound 

## 2017-11-30 NOTE — ED Notes (Signed)
ED Provider at bedside. 

## 2017-11-30 NOTE — ED Provider Notes (Signed)
[facility] Emergency Department Provider Note  ____________________________________________   First MD Initiated Contact with Patient 11/30/17 [phone]     (approximate)  I have reviewed the triage vital signs and the nursing notes.   HISTORY  Chief Complaint Abdominal Pain    HPI [name] [name] is a 22 y.o. female with medical history as listed below who most recently had a child approximately 3 months ago and had a concurrent tubal ligation.  She presents by private vehicle for evaluation of intermittent lower abdominal pain over the last 3 weeks.  However she reports that today the pain has been worse than usual and was severe with pain that seem to start on the left lower quadrant and moved to the right lower quadrant.  It is currently mild but it was severe earlier.  There is no particular pattern to the pain; nothing in particular makes it better or worse.  It is primarily a dull ache in her left lower quadrant but at times migrates to the right lower quadrant as well and is at times sharp.  She denies nausea, vomiting, fever/chills, chest pain, shortness of breath, loss of appetite.  She has had diarrhea fairly consistently over the last couple of weeks.  She has had no vaginal discharge and no vaginal bleeding other than her usual period which is about a month ago.  She is sexually active with a single partner and states that she had no sexually transmitted diseases during the time of her recent pregnancy.  Past Medical History:  Diagnosis Date  . Anemia   . Breast mass   . H/O rubella   . H/O varicella   . Hx of ovarian cyst   . Preeclampsia, severe    with 1st pregnancy  . Yeast infection     Patient Active Problem List   Diagnosis Date Noted  . Encounter for sterilization 08/26/2017  . Postpartum care following vaginal delivery 08/26/2017  . Abdominal cramping affecting pregnancy 08/03/2017  . Anemia of mother in pregnancy 06/17/2017  . Migraine  06/13/2017  . Nausea and vomiting during pregnancy 01/20/2017  . Pregnancy, supervision, high-risk 01/10/2017  . History of gestational hypertension 11/28/2011    Past Surgical History:  Procedure Laterality Date  . AUGMENTATION MAMMAPLASTY    . NO PAST SURGERIES    . TUBAL LIGATION Bilateral 08/26/2017   Procedure: POST PARTUM TUBAL LIGATION;  Surgeon: Conard Novak, MD;  Location: [facility] ORS;  Service: Gynecology;  Laterality: Bilateral;    Prior to Admission medications   Medication Sig Start Date End Date Taking? Authorizing Provider  ibuprofen (ADVIL,MOTRIN) 600 MG tablet Take 1 tablet (600 mg total) by mouth every 6 (six) hours. 08/27/17   Tresea Mall, CNM  Prenatal Vit-Fe Fumarate-FA (PRENATAL VITAMIN PO) Take by mouth.    [provider]    Allergies Penicillins  Family History  Problem Relation Age of Onset  . Alcohol abuse Mother   . Depression Mother   . Alcohol abuse Father   . Diabetes Maternal Grandmother   . Breast cancer Maternal Grandmother   . Mental retardation Cousin   . Autism Cousin   . Breast cancer Maternal Aunt     Social History Social History   Tobacco Use  . Smoking status: Never Smoker  . Smokeless tobacco: Never Used  Substance Use Topics  . Alcohol use: No  . Drug use: No    Review of Systems Constitutional: No fever/chills Eyes: No visual changes. ENT: No sore  throat. Cardiovascular: Denies chest pain. Respiratory: Denies shortness of breath. Gastrointestinal: No abdominal pain.  No nausea, no vomiting.  No diarrhea.  No constipation. Genitourinary: Negative for dysuria. Musculoskeletal: Negative for neck pain.  Negative for back pain. Integumentary: Negative for rash. Neurological: Negative for headaches, focal weakness or numbness.   ____________________________________________   PHYSICAL EXAM:  VITAL SIGNS: ED Triage Vitals  Enc Vitals Group     BP 11/30/17 0014 106/68     Pulse Rate 11/30/17 0014 74      Resp 11/30/17 0014 18     Temp 11/30/17 0014 98.5 F (36.9 C)     Temp Source 11/30/17 0014 Oral     SpO2 11/30/17 0014 99 %     Weight 11/30/17 0013 59 kg (130 lb)     Height 11/30/17 0013 1.549 m (5\' 1" )     Head Circumference --      Peak Flow --      Pain Score 11/30/17 0013 4     Pain Loc --      Pain Edu? --      Excl. in GC? --     Constitutional: Alert and oriented. Well appearing and in no acute distress. Eyes: Conjunctivae are normal.  Head: Atraumatic. Nose: No congestion/rhinnorhea. Mouth/Throat: Mucous membranes are moist. Neck: No stridor.  No meningeal signs.   Cardiovascular: Normal rate, regular rhythm. Good peripheral circulation. Grossly normal heart sounds. Respiratory: Normal respiratory effort.  No retractions. Lungs CTAB. Gastrointestinal: Soft and nondistended.  Tenderness to palpation throughout the lower abdomen.  There is mild rebound tenderness, no guarding, no pain specifically at McBurney's point but throughout the lower abdomen.  No upper abdominal tenderness, negative Murphy sign. Genitourinary: Normal external exam.  Normal vaginal secretions, no bleeding.  No cervical motion tenderness nor adnexal tenderness on bimanual exam.  ED chaperone present throughout exam. Musculoskeletal: No lower extremity tenderness nor edema. No gross deformities of extremities. Neurologic:  Normal speech and language. No gross focal neurologic deficits are appreciated.  Skin:  Skin is warm, dry and intact. No rash noted. Psychiatric: Mood and affect are normal. Speech and behavior are normal.  ____________________________________________   LABS (all labs ordered are listed, but only abnormal results are displayed)  Labs Reviewed  WET PREP, GENITAL - Abnormal; Notable for the following components:      Result Value   WBC, Wet Prep HPF POC FEW (*)    All other components within normal limits  COMPREHENSIVE METABOLIC PANEL - Abnormal; Notable for the following  components:   Glucose, Bld 103 (*)    All other components within normal limits  CBC - Abnormal; Notable for the following components:   HCT 34.4 (*)    All other components within normal limits  URINALYSIS, COMPLETE (UACMP) WITH MICROSCOPIC - Abnormal; Notable for the following components:   Color, Urine YELLOW (*)    APPearance CLEAR (*)    All other components within normal limits  CHLAMYDIA/NGC RT PCR ([facility] ONLY)  LIPASE, BLOOD  POC URINE PREG, ED  POCT PREGNANCY, URINE   ____________________________________________  EKG  None - EKG not ordered by ED physician ____________________________________________  RADIOLOGY   ED MD interpretation:  left corpus luteal cyst with some pelvic free fluid  Official radiology report(s): US Transvaginal Non-ob  Result Date: 11/30/2017 CLINICAL DATA:  Initial evaluation for acute intermittent pelvic pain. EXAM: TRANSABDOMINAL AND TRANSVAGINAL ULTRASOUND OF PELVIS DOPPLER ULTRASOUND OF OVARIES TECHNIQUE: Both transabdominal and transvaginal ultrasound examinations of the pelvis were  performed. Transabdominal technique was performed for global imaging of the pelvis including uterus, ovaries, adnexal regions, and pelvic cul-de-sac. It was necessary to proceed with endovaginal exam following the transabdominal exam to visualize the uterus, endometrium, and ovaries. Color and duplex Doppler ultrasound was utilized to evaluate blood flow to the ovaries. COMPARISON:  Prior CT from 08/10/2013 FINDINGS: Uterus Measurements: 8.2 x 4.8 x 6.4 cm. No fibroids or other mass visualized. Endometrium Thickness: 8.7 mm.  No focal abnormality visualized. Right ovary Measurements: 2.4 x 1.5 x 1.6 cm. Normal appearance/no adnexal mass. Left ovary Measurements: 3.4 x 2.2 x 2.4 cm. 2.2 x 2.2 x 2.0 cm corpus luteal cyst noted. No other adnexal mass. Pulsed Doppler evaluation of both ovaries demonstrates normal low-resistance arterial and venous waveforms. Other  findings Small volume free physiologic fluid within the pelvis. IMPRESSION: 1. 2.2 cm left ovarian corpus luteal cyst with associated small volume free physiologic fluid within the pelvis. 2. Otherwise unremarkable and normal pelvic ultrasound. No evidence for torsion or other acute abnormality. Electronically Signed   By: Rise Mu M.D.   On: 11/30/2017 05:07   US Pelvis Complete  Result Date: 11/30/2017 CLINICAL DATA:  Initial evaluation for acute intermittent pelvic pain. EXAM: TRANSABDOMINAL AND TRANSVAGINAL ULTRASOUND OF PELVIS DOPPLER ULTRASOUND OF OVARIES TECHNIQUE: Both transabdominal and transvaginal ultrasound examinations of the pelvis were performed. Transabdominal technique was performed for global imaging of the pelvis including uterus, ovaries, adnexal regions, and pelvic cul-de-sac. It was necessary to proceed with endovaginal exam following the transabdominal exam to visualize the uterus, endometrium, and ovaries. Color and duplex Doppler ultrasound was utilized to evaluate blood flow to the ovaries. COMPARISON:  Prior CT from 08/10/2013 FINDINGS: Uterus Measurements: 8.2 x 4.8 x 6.4 cm. No fibroids or other mass visualized. Endometrium Thickness: 8.7 mm.  No focal abnormality visualized. Right ovary Measurements: 2.4 x 1.5 x 1.6 cm. Normal appearance/no adnexal mass. Left ovary Measurements: 3.4 x 2.2 x 2.4 cm. 2.2 x 2.2 x 2.0 cm corpus luteal cyst noted. No other adnexal mass. Pulsed Doppler evaluation of both ovaries demonstrates normal low-resistance arterial and venous waveforms. Other findings Small volume free physiologic fluid within the pelvis. IMPRESSION: 1. 2.2 cm left ovarian corpus luteal cyst with associated small volume free physiologic fluid within the pelvis. 2. Otherwise unremarkable and normal pelvic ultrasound. No evidence for torsion or other acute abnormality. Electronically Signed   By: Rise Mu M.D.   On: 11/30/2017 05:07   [place] Art/ven Flow Abd Pelv  Doppler  Result Date: 11/30/2017 CLINICAL DATA:  Initial evaluation for acute intermittent pelvic pain. EXAM: TRANSABDOMINAL AND TRANSVAGINAL ULTRASOUND OF PELVIS DOPPLER ULTRASOUND OF OVARIES TECHNIQUE: Both transabdominal and transvaginal ultrasound examinations of the pelvis were performed. Transabdominal technique was performed for global imaging of the pelvis including uterus, ovaries, adnexal regions, and pelvic cul-de-sac. It was necessary to proceed with endovaginal exam following the transabdominal exam to visualize the uterus, endometrium, and ovaries. Color and duplex Doppler ultrasound was utilized to evaluate blood flow to the ovaries. COMPARISON:  Prior CT from 08/10/2013 FINDINGS: Uterus Measurements: 8.2 x 4.8 x 6.4 cm. No fibroids or other mass visualized. Endometrium Thickness: 8.7 mm.  No focal abnormality visualized. Right ovary Measurements: 2.4 x 1.5 x 1.6 cm. Normal appearance/no adnexal mass. Left ovary Measurements: 3.4 x 2.2 x 2.4 cm. 2.2 x 2.2 x 2.0 cm corpus luteal cyst noted. No other adnexal mass. Pulsed Doppler evaluation of both ovaries demonstrates normal low-resistance arterial and venous waveforms. Other findings Small volume  free physiologic fluid within the pelvis. IMPRESSION: 1. 2.2 cm left ovarian corpus luteal cyst with associated small volume free physiologic fluid within the pelvis. 2. Otherwise unremarkable and normal pelvic ultrasound. No evidence for torsion or other acute abnormality. Electronically Signed   By: Rise Mu M.D.   On: 11/30/2017 05:07    ____________________________________________   PROCEDURES  Critical Care performed: No   Procedure(s) performed:   Procedures   ____________________________________________   INITIAL IMPRESSION / ASSESSMENT AND PLAN / ED COURSE  As part of my medical decision making, I reviewed the following data within the electronic MEDICAL RECORD NUMBER Nursing notes reviewed and incorporated, Labs reviewed ,  Old chart reviewed and Notes from prior ED visits    Differential diagnosis includes, but is not limited to, ovarian cysts, ovarian torsion, PID/TOA, appendicitis, diverticulitis, less likely complication from her tubal ligation 3 months ago.  The patient has been having symptoms for about 3 weeks and they did become more severe tonight, however they have gotten better and she is in no distress at this time.  Her vital signs are stable and within normal limits and she is afebrile.  She has a normal urinalysis, normal comprehensive metabolic panel, negative urine pregnancy test, and no leukocytosis.  She does have tenderness to palpation of the lower abdomen but I feel it is very unlikely that she is suffering from appendicitis given the history of present illness, her normal vital signs, and her normal lab work, as well as the fact that she has had no nausea nor vomiting nor loss of appetite.  Similarly, because of the history of present illness, her labs and vitals, and her age, I think diverticulitis is unlikely as well.  I think it is more likely to be adnexal, although though I doubt torsion given that the pain was not intolerable even though she did describe it as severe.  We had a discussion about CT scan versus ultrasound and I explained my only hesitation about a CT scan is that she is a young woman and I am reluctant to get an unnecessary CT scan if she does not require it.  She states that she understands.  We will proceed with pelvic ultrasound including Dopplers and then I will reassess.  Clinical Course as of Dec 01 515  Wed Nov 30, 2017  0424 Unremarkable wet prep  Wet prep, genital(!) [CF]  9562 Ultrasound demonstrates a corpus luteal cyst with some pelvic free fluid.  I think this is a very likely explanation for the type of pain that she is having that is nonspecific and generalized throughout her lower abdomen with some rebound but otherwise she is well-appearing.  I discussed the  results with her and again offered a CT scan but also explained that that would not be my recommendation.  She is comfortable with the plan for discharge and outpatient follow-up.  I provided my usual customary recommendations and return precautions and she agrees with the plan.  US Pelvis Complete [CF]    Clinical Course User Index [CF] Loleta Rose, MD    ____________________________________________  FINAL CLINICAL IMPRESSION(S) / ED DIAGNOSES  Final diagnoses:  Cyst of left ovary  Pelvic pain     MEDICATIONS GIVEN DURING THIS VISIT:  Medications - No data to display   ED Discharge Orders    None       Note:  This document was prepared using Dragon voice recognition software and may include unintentional dictation errors.    York Cerise,  Kandee Keen, MD 11/30/17 [phone]

## 2017-11-30 NOTE — ED Triage Notes (Signed)
Patient ambulatory to triage with steady gait, without difficulty or distress noted; pt reports right lower abd pain that began as umbilical pain this evening accomp by urinary urgency & frequency

## 2018-12-29 IMAGING — US US BREAST*R* LIMITED INC AXILLA
1 series · 2 of 2 positions shown · non-contrast
Comparison: None

CLINICAL DATA: Patient reports a palpable tender lump in the upper
outer right breast, which she has noticed for 1-2 years.

EXAM:
ULTRASOUND OF THE RIGHT BREAST

[Series 1: us breast*right* limited inc axilla · 0.07mm/px · 2 of 2 slices shown]
[im 1/2]
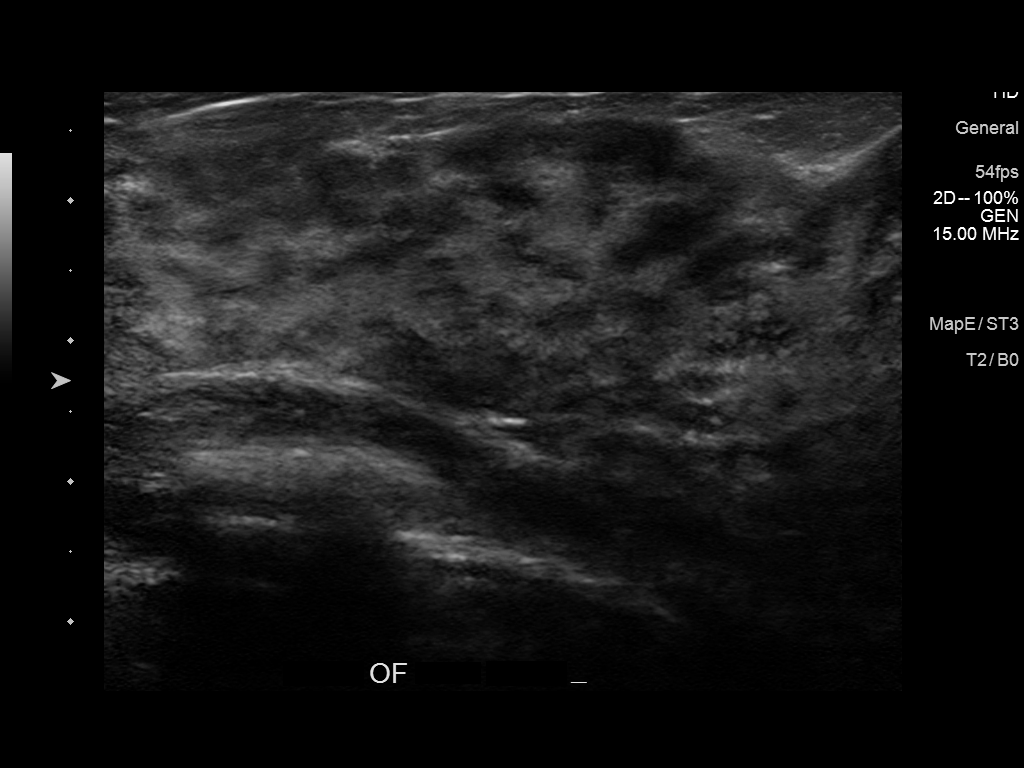
[im 2/2]
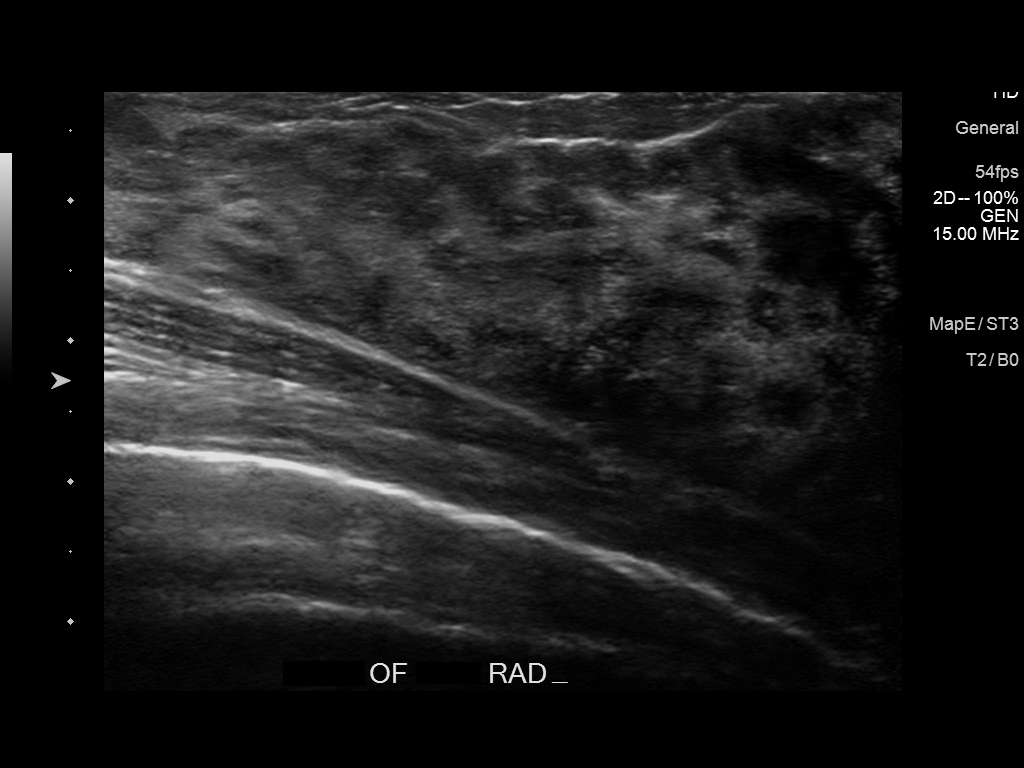

[2 of 2 positions shown; findings below may reference images not displayed]

FINDINGS: On physical exam, there is nodular, tender mobile tissue throughout
the upper outer right breast without a discrete mass.

Targeted ultrasound is performed, showing heterogeneous
fibroglandular tissue. No masses or cysts.
IMPRESSION: Negative exam.  No evidence of breast malignancy.

RECOMMENDATION:
Screening mammogram at age 39 unless there are persistent or
intervening clinical concerns. (Code:JR-4-RLW)

I have discussed the findings and recommendations with the patient.
Results were also provided in writing at the conclusion of the
visit. If applicable, a reminder letter will be sent to the patient
regarding the next appointment.

BI-RADS CATEGORY  1: Negative.

## 2020-05-13 IMAGING — US US ART/VEN ABD/PELV/SCROTUM DOPPLER LTD
1 series · 13 of 25 positions shown · non-contrast
Comparison: Prior CT from 08/10/2013

CLINICAL DATA: Initial evaluation for acute intermittent pelvic
pain.

EXAM:
TRANSABDOMINAL AND TRANSVAGINAL ULTRASOUND OF PELVIS
DOPPLER ULTRASOUND OF OVARIES
TECHNIQUE: Both transabdominal and transvaginal ultrasound examinations of the
pelvis were performed. Transabdominal technique was performed for
global imaging of the pelvis including uterus, ovaries, adnexal
regions, and pelvic cul-de-sac.
It was necessary to proceed with endovaginal exam following the
transabdominal exam to visualize the uterus, endometrium, and
ovaries. Color and duplex Doppler ultrasound was utilized to
evaluate blood flow to the ovaries.

[Series 1: us art/ven abd/pelv/scrotum doppler ltd · 0.20mm/px · 13 of 83 slices shown]
[im 1/83]
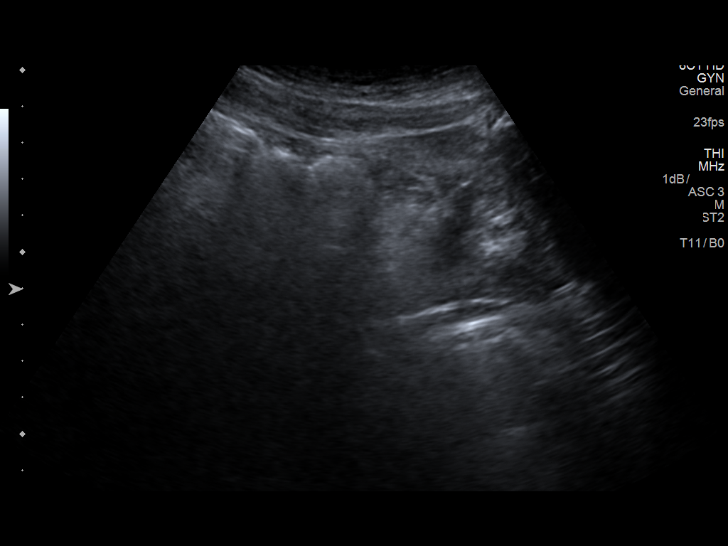
[im 7/83]
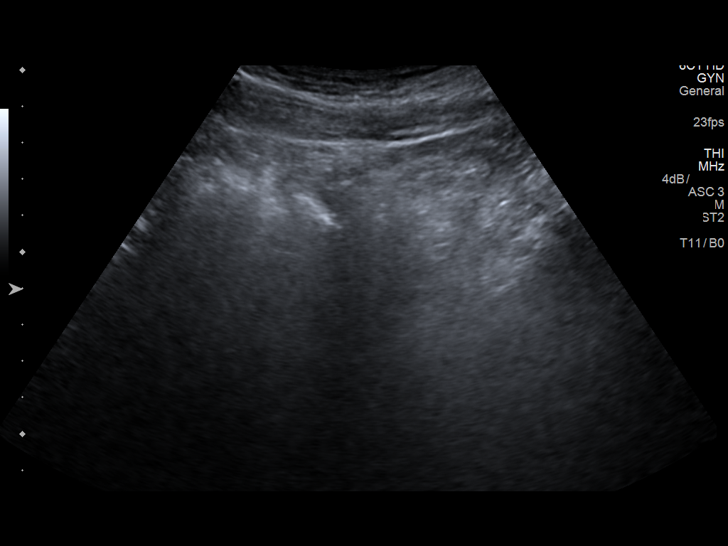
[im 14/83]
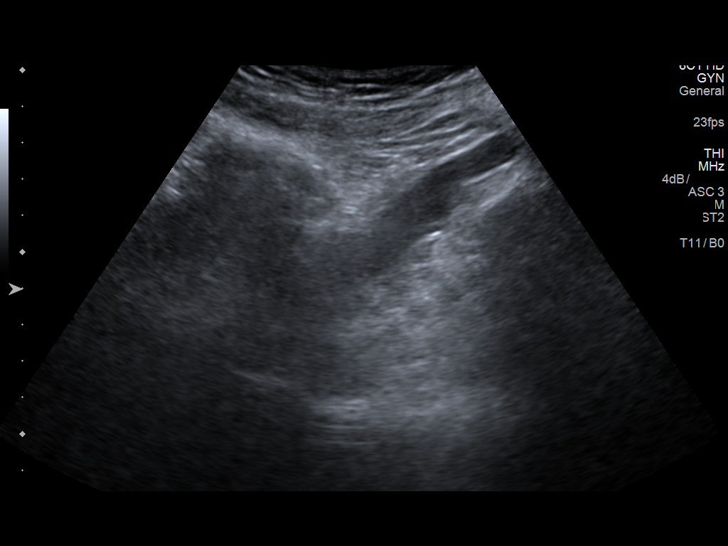
[im 21/83]
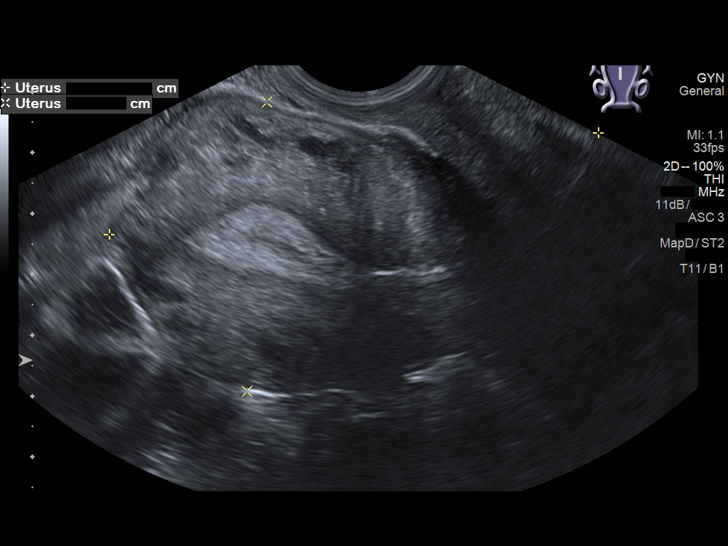
[im 28/83]
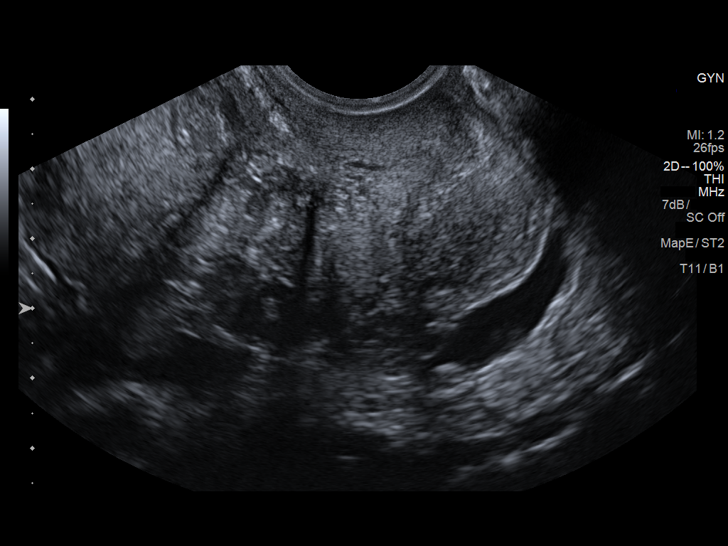
[im 35/83]
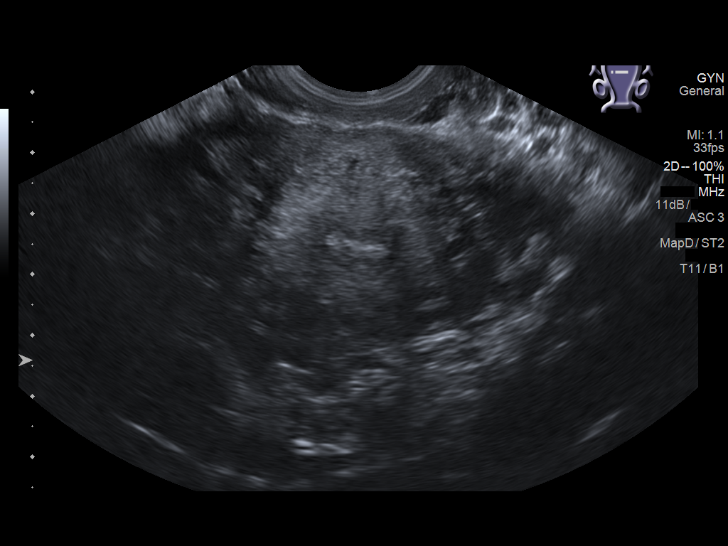
[im 42/83]
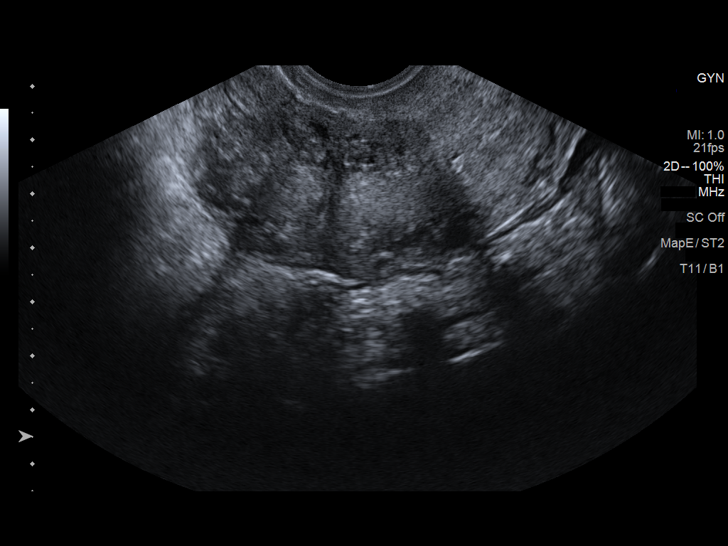
[im 48/83]
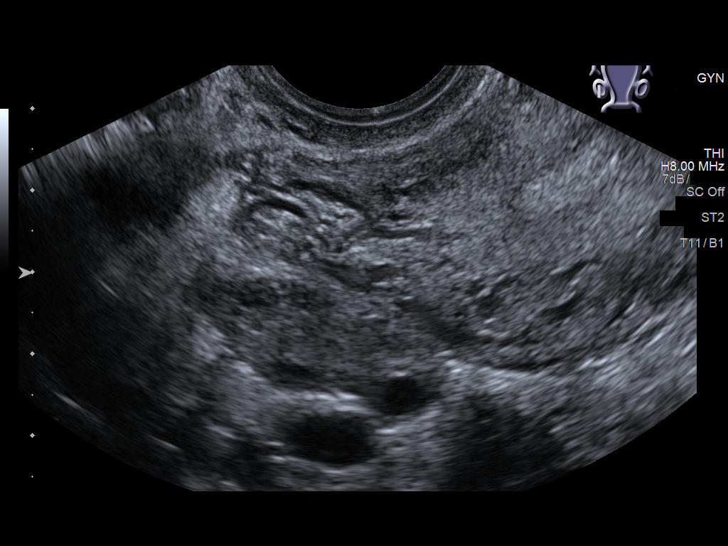
[im 55/83]
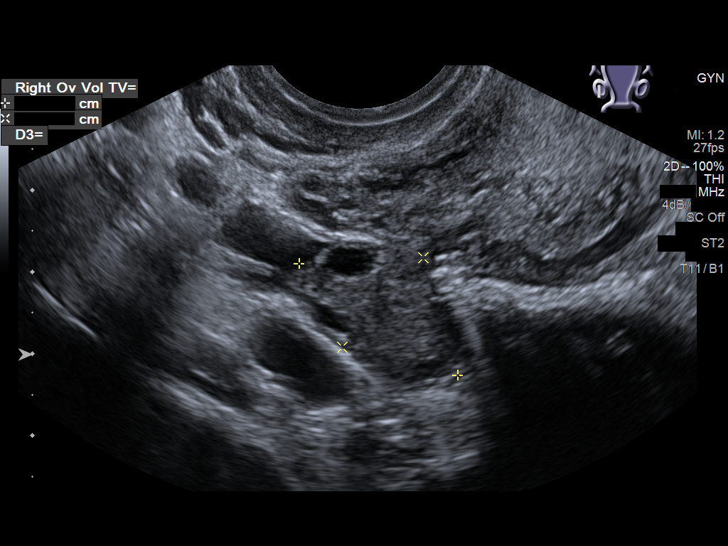
[im 62/83]
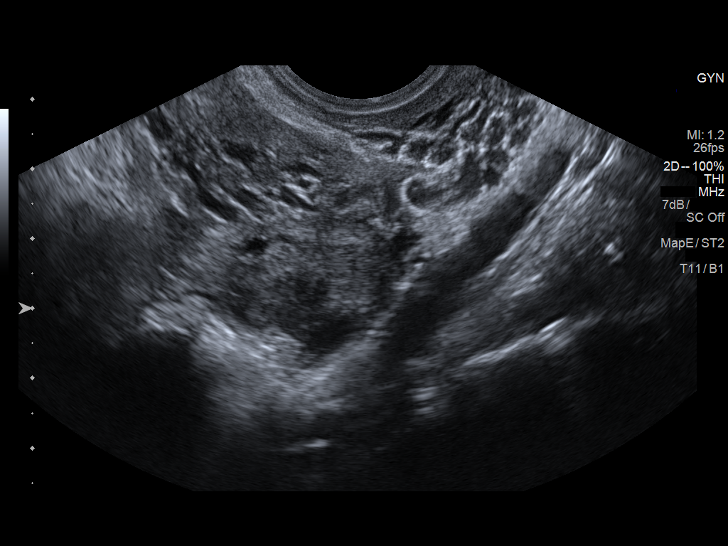
[im 69/83]
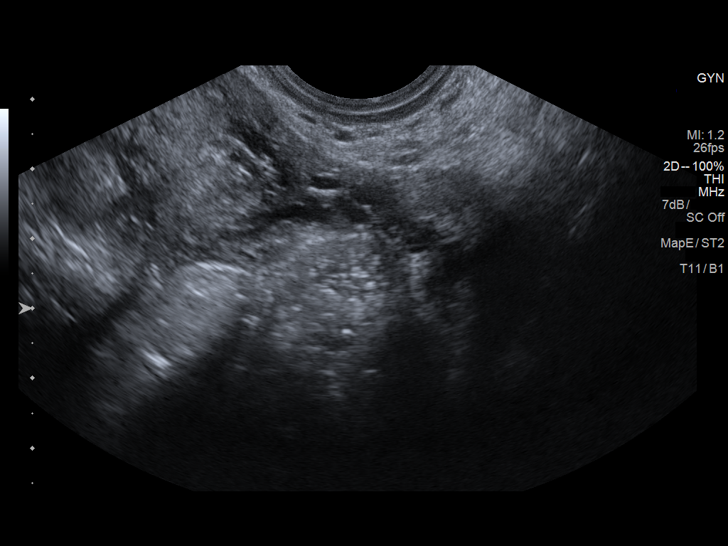
[im 76/83]
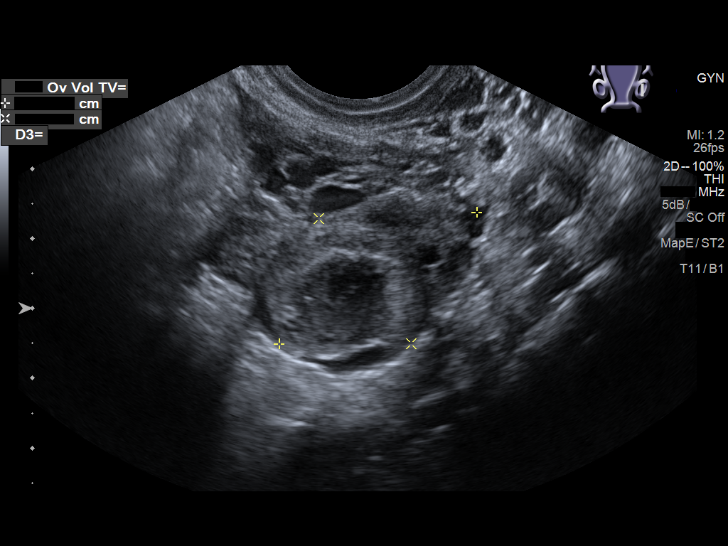
[im 83/83]
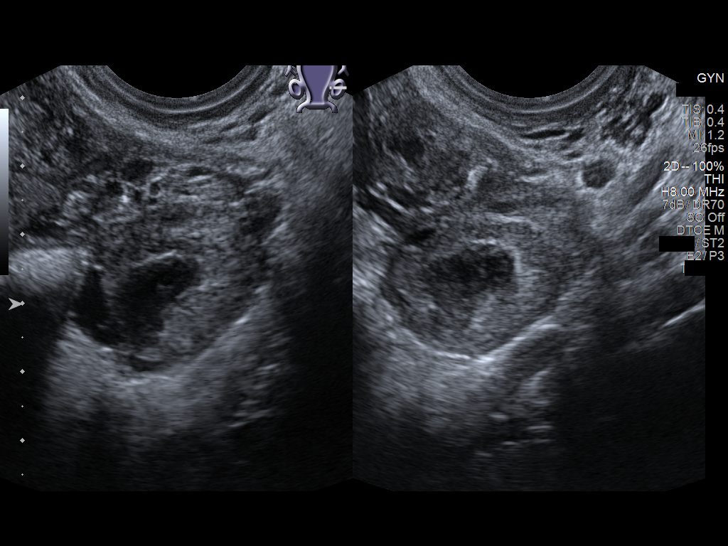

[13 of 25 positions shown; findings below may reference images not displayed]

FINDINGS: Uterus

Measurements: 8.2 x 4.8 x 6.4 cm. No fibroids or other mass
visualized.

Endometrium

Thickness: 8.7 mm.  No focal abnormality visualized.

Right ovary

Measurements: 2.4 x 1.5 x 1.6 cm. Normal appearance/no adnexal mass.

Left ovary

Measurements: 3.4 x 2.2 x 2.4 cm. 2.2 x 2.2 x 2.0 cm corpus luteal
cyst noted. No other adnexal mass.

Pulsed Doppler evaluation of both ovaries demonstrates normal
low-resistance arterial and venous waveforms.

Other findings

Small volume free physiologic fluid within the pelvis.
IMPRESSION: 1. 2.2 cm left ovarian corpus luteal cyst with associated small
volume free physiologic fluid within the pelvis.
2. Otherwise unremarkable and normal pelvic ultrasound. No evidence
for torsion or other acute abnormality.

## 2020-09-10 DIAGNOSIS — J028 Acute pharyngitis due to other specified organisms: Secondary | ICD-10-CM | POA: Diagnosis not present

## 2020-09-10 DIAGNOSIS — J029 Acute pharyngitis, unspecified: Secondary | ICD-10-CM | POA: Diagnosis not present

## 2020-11-04 DIAGNOSIS — R051 Acute cough: Secondary | ICD-10-CM | POA: Diagnosis not present

## 2020-11-04 DIAGNOSIS — J028 Acute pharyngitis due to other specified organisms: Secondary | ICD-10-CM | POA: Diagnosis not present

## 2020-11-04 DIAGNOSIS — Z03818 Encounter for observation for suspected exposure to other biological agents ruled out: Secondary | ICD-10-CM | POA: Diagnosis not present

## 2021-02-05 ENCOUNTER — Encounter: Payer: Self-pay | Admitting: Oncology

## 2021-02-05 ENCOUNTER — Emergency Department
Admission: EM | Admit: 2021-02-05 | Discharge: 2021-02-05 | Disposition: A | Discharge status: Home / Self Care | Payer: Medicaid Other | Attending: Emergency Medicine | Admitting: Emergency Medicine

## 2021-02-05 ENCOUNTER — Other Ambulatory Visit: Payer: Self-pay

## 2021-02-05 DIAGNOSIS — S6992XA Unspecified injury of left wrist, hand and finger(s), initial encounter: Secondary | ICD-10-CM | POA: Diagnosis present

## 2021-02-05 DIAGNOSIS — S61213A Laceration without foreign body of left middle finger without damage to nail, initial encounter: Secondary | ICD-10-CM | POA: Insufficient documentation

## 2021-02-05 DIAGNOSIS — W260XXA Contact with knife, initial encounter: Secondary | ICD-10-CM | POA: Diagnosis not present

## 2021-02-05 MED ORDER — LIDOCAINE HCL (PF) 1 % IJ SOLN
5.0000 mL | Freq: Once | INTRAMUSCULAR | Status: AC
  Administered 2021-02-05: 5 mL via INTRADERMAL
  Filled 2021-02-05: qty 5

## 2021-02-05 NOTE — Discharge Instructions (Signed)
Keep the area as dry as possible. Go to the urgent care, your family doctor, or remove the sutures herself in 7 to 10 days. Return emergency department for any sign of infection

## 2021-02-05 NOTE — ED Notes (Signed)
Pt states she cut left middle finger with kitchen knife. Finger bandaged, bleeding controlled.

## 2021-02-05 NOTE — ED Provider Notes (Signed)
[facility] Emergency Department Provider Note  ____________________________________________   Event Date/Time   First MD Initiated Contact with Patient 02/05/21 1324     (approximate)  I have reviewed the triage vital signs and the nursing notes.   HISTORY  Chief Complaint Laceration    HPI [name] [name] is a 25 y.o. female presents emergency department complaint of a laceration to the left middle finger with a kitchen knife.  Patient is right-handed.  This happened prior to arrival.  Tdap is up-to-date.  Past Medical History:  Diagnosis Date   Anemia    Breast mass    H/O rubella    H/O varicella    Hx of ovarian cyst    Preeclampsia, severe    with 1st pregnancy   Yeast infection     Patient Active Problem List   Diagnosis Date Noted   Encounter for sterilization 08/26/2017   Postpartum care following vaginal delivery 08/26/2017   Abdominal cramping affecting pregnancy 08/03/2017   Anemia of mother in pregnancy 06/17/2017   Migraine 06/13/2017   Nausea and vomiting during pregnancy 01/20/2017   Pregnancy, supervision, high-risk 01/10/2017   History of gestational hypertension 11/28/2011    Past Surgical History:  Procedure Laterality Date   AUGMENTATION MAMMAPLASTY     NO PAST SURGERIES     TUBAL LIGATION Bilateral 08/26/2017   Procedure: POST PARTUM TUBAL LIGATION;  Surgeon: Will Bonnet, MD;  Location: [facility] ORS;  Service: Gynecology;  Laterality: Bilateral;    Prior to Admission medications   Medication Sig Start Date End Date Taking? Authorizing Provider  ibuprofen (ADVIL,MOTRIN) 600 MG tablet Take 1 tablet (600 mg total) by mouth every 6 (six) hours. 08/27/17   Rod Can, CNM  Prenatal Vit-Fe Fumarate-FA (PRENATAL VITAMIN PO) Take by mouth.    [provider]    Allergies Penicillins  Family History  Problem Relation Age of Onset   Alcohol abuse Mother    Depression Mother    Alcohol abuse Father     Diabetes Maternal Grandmother    Breast cancer Maternal Grandmother    Mental retardation Cousin    Autism Cousin    Breast cancer Maternal Aunt     Social History Social History   Tobacco Use   Smoking status: Never   Smokeless tobacco: Never  Substance Use Topics   Alcohol use: No   Drug use: No    Review of Systems  Constitutional: No fever/chills Eyes: No visual changes. ENT: No sore throat. Respiratory: Denies cough Cardiovascular: Denies chest pain Gastrointestinal: Denies abdominal pain Genitourinary: Negative for dysuria. Musculoskeletal: Negative for back pain. Skin: Negative for rash. Psychiatric: no mood changes,     ____________________________________________   PHYSICAL EXAM:  VITAL SIGNS: ED Triage Vitals  Enc Vitals Group     BP 02/05/21 1301 128/76     Pulse Rate 02/05/21 1301 88     Resp 02/05/21 1301 17     Temp 02/05/21 1301 98.1 F (36.7 C)     Temp Source 02/05/21 1301 Oral     SpO2 02/05/21 1301 100 %     Weight 02/05/21 1302 120 lb (54.4 kg)     Height 02/05/21 1302 5\' 1"  (1.549 m)     Head Circumference --      Peak Flow --      Pain Score 02/05/21 1301 4     Pain Loc --      Pain Edu? --      Excl. in  [place]? --     Constitutional: Alert and oriented. Well appearing and in no acute distress. Eyes: Conjunctivae are normal.  Head: Atraumatic. Nose: No congestion/rhinnorhea. Mouth/Throat: Mucous membranes are moist.   Neck:  supple no lymphadenopathy noted Cardiovascular: Normal rate, regular rhythm.  Respiratory: Normal respiratory effort.  No retractions, l GU: deferred Musculoskeletal: FROM all extremities, warm and well perfused, left third finger has laceration, no foreign body, full range of motion of the finger, neurovascular is intact Neurologic:  Normal speech and language.  Skin:  Skin is warm, dry. No rash noted. Psychiatric: Mood and affect are normal. Speech and behavior are  normal.  ____________________________________________   LABS (all labs ordered are listed, but only abnormal results are displayed)  Labs Reviewed - No data to display ____________________________________________   ____________________________________________  RADIOLOGY    ____________________________________________   PROCEDURES  Procedure(s) performed:   Marland KitchenMarland KitchenLaceration Repair  Date/Time: 02/05/2021 3:39 PM Performed by: Versie Starks, PA-C Authorized by: Versie Starks, PA-C   Consent:    Consent obtained:  Verbal   Consent given by:  Patient   Risks discussed:  Infection, pain, retained foreign body, tendon damage, poor cosmetic result, need for additional repair, nerve damage, poor wound healing and vascular damage   Alternatives discussed:  No treatment Universal protocol:    Procedure explained and questions answered to patient or proxy's satisfaction: yes     Patient identity confirmed:  Verbally with patient Anesthesia:    Anesthesia method:  Nerve block   Block anesthetic:  Lidocaine 1% w/o epi   Block injection procedure:  Anatomic landmarks identified, introduced needle, incremental injection, anatomic landmarks palpated and negative aspiration for blood   Block outcome:  Anesthesia achieved Laceration details:    Location:  Finger   Finger location:  L long finger   Length (cm):  1 Pre-procedure details:    Preparation:  Patient was prepped and draped in usual sterile fashion Exploration:    Limited defect created (wound extended): no     Hemostasis achieved with:  Direct pressure   Imaging outcome: foreign body not noted     Wound exploration: wound explored through full range of motion     Wound extent: no foreign bodies/material noted, no muscle damage noted, no nerve damage noted, no tendon damage noted, no underlying fracture noted and no vascular damage noted     Contaminated: no   Treatment:    Area cleansed with:  Povidone-iodine and  saline   Amount of cleaning:  Standard   Irrigation solution:  Sterile saline   Irrigation method:  Tap   Debridement:  None   Undermining:  None Skin repair:    Repair method:  Sutures   Suture size:  5-0   Suture material:  Nylon   Suture technique:  Simple interrupted   Number of sutures:  2 Approximation:    Approximation:  Close Repair type:    Repair type:  Simple Post-procedure details:    Dressing:  Non-adherent dressing   Procedure completion:  Tolerated well, no immediate complications    ____________________________________________   INITIAL IMPRESSION / ASSESSMENT AND PLAN / ED COURSE  Pertinent labs & imaging results that were available during my care of the patient were reviewed by me and considered in my medical decision making (see chart for details).   Patient is a 25 year old female presents with laceration to the left third finger.  See HPI.  Physical exam shows patient be stable  See procedure note for  laceration repair  Patient's Tdap is up-to-date so we did not give additional Tdap here today.  She is to keep the areas clean and dry as possible.  She works in Chief Strategy Officer at [employer].  She was given a work note stating she had keep the hand dry and could not wear gloves for 1 week.  Patient is in agreement treatment plan.  Discharged in stable condition.  [name] [name] was evaluated in Emergency Department on 02/05/2021 for the symptoms described in the history of present illness. She was evaluated in the context of the global COVID-19 pandemic, which necessitated consideration that the patient might be at risk for infection with the SARS-CoV-2 virus that causes COVID-19. Institutional protocols and algorithms that pertain to the evaluation of patients at risk for COVID-19 are in a state of rapid change based on information released by regulatory bodies including the CDC and federal and state organizations. These policies and algorithms were followed during the  patient's care in the ED.    As part of my medical decision making, I reviewed the following data within the electronic MEDICAL RECORD NUMBER Nursing notes reviewed and incorporated, Old chart reviewed, Notes from prior ED visits, and [place] Controlled Substance Database  ____________________________________________   FINAL CLINICAL IMPRESSION(S) / ED DIAGNOSES  Final diagnoses:  Laceration of left middle finger without foreign body without damage to nail, initial encounter      NEW MEDICATIONS STARTED DURING THIS VISIT:  Discharge Medication List as of 02/05/2021  2:13 PM       Note:  This document was prepared using Dragon voice recognition software and may include unintentional dictation errors.    Faythe Ghee, PA-C 02/05/21 1541    Minna Antis, MD 02/06/21 2101

## 2021-02-05 NOTE — ED Triage Notes (Signed)
Pt comes pov with laceration to left middle finger. Pt states she cut it with a kitchen knife. Bleeding controlled at this time

## 2021-03-13 DIAGNOSIS — K0889 Other specified disorders of teeth and supporting structures: Secondary | ICD-10-CM | POA: Diagnosis not present

## 2021-03-13 DIAGNOSIS — K029 Dental caries, unspecified: Secondary | ICD-10-CM | POA: Diagnosis not present

## 2021-09-01 ENCOUNTER — Emergency Department
Admission: EM | Admit: 2021-09-01 | Discharge: 2021-09-01 | Discharge status: Against Medical Advice | Payer: Medicaid Other | Attending: Emergency Medicine | Admitting: Emergency Medicine

## 2021-09-01 ENCOUNTER — Encounter: Payer: Self-pay | Admitting: Emergency Medicine

## 2021-09-01 DIAGNOSIS — R82998 Other abnormal findings in urine: Secondary | ICD-10-CM | POA: Diagnosis not present

## 2021-09-01 DIAGNOSIS — R1032 Left lower quadrant pain: Secondary | ICD-10-CM | POA: Diagnosis not present

## 2021-09-01 DIAGNOSIS — R309 Painful micturition, unspecified: Secondary | ICD-10-CM | POA: Insufficient documentation

## 2021-09-01 DIAGNOSIS — Z5321 Procedure and treatment not carried out due to patient leaving prior to being seen by health care provider: Secondary | ICD-10-CM | POA: Insufficient documentation

## 2021-09-01 LAB — CBC
HCT: 36.5 % (ref 36.0–46.0)
Hemoglobin: 11.9 g/dL — ABNORMAL LOW (ref 12.0–15.0)
MCH: 29.2 pg (ref 26.0–34.0)
MCHC: 32.6 g/dL (ref 30.0–36.0)
MCV: 89.5 fL (ref 80.0–100.0)
Platelets: 267 10*3/uL (ref 150–400)
RBC: 4.08 MIL/uL (ref 3.87–5.11)
RDW: 12.8 % (ref 11.5–15.5)
WBC: 12 10*3/uL — ABNORMAL HIGH (ref 4.0–10.5)
nRBC: 0 % (ref 0.0–0.2)

## 2021-09-01 LAB — COMPREHENSIVE METABOLIC PANEL
ALT: 10 U/L (ref 0–44)
AST: 15 U/L (ref 15–41)
Albumin: 4.1 g/dL (ref 3.5–5.0)
Alkaline Phosphatase: 40 U/L (ref 38–126)
Anion gap: 4 — ABNORMAL LOW (ref 5–15)
BUN: 13 mg/dL (ref 6–20)
CO2: 26 mmol/L (ref 22–32)
Calcium: 9.2 mg/dL (ref 8.9–10.3)
Chloride: 109 mmol/L (ref 98–111)
Creatinine, Ser: 0.73 mg/dL (ref 0.44–1.00)
GFR, Estimated: >60 mL/min (ref >60)
Glucose, Bld: 110 mg/dL — ABNORMAL HIGH (ref 70–99)
Potassium: 3.4 mmol/L — ABNORMAL LOW (ref 3.5–5.1)
Sodium: 139 mmol/L (ref 135–145)
Total Bilirubin: 0.3 mg/dL (ref 0.3–1.2)
Total Protein: 7.4 g/dL (ref 6.5–8.1)

## 2021-09-01 LAB — LIPASE, BLOOD: Lipase: 32 U/L (ref 11–51)

## 2021-09-01 LAB — POC URINE PREG, ED: Preg Test, Ur: NEGATIVE

## 2021-09-01 NOTE — ED Triage Notes (Signed)
Pt presents via POV with complaints of LLQ pain that started this evening with burning/foul smelling urination. Denies N/V/D.

## 2021-09-02 ENCOUNTER — Emergency Department
Admission: EM | Admit: 2021-09-02 | Discharge: 2021-09-02 | Disposition: A | Discharge status: Home / Self Care | Payer: Medicaid Other | Attending: Emergency Medicine | Admitting: Emergency Medicine

## 2021-09-02 ENCOUNTER — Other Ambulatory Visit: Payer: Self-pay

## 2021-09-02 ENCOUNTER — Emergency Department: Payer: Medicaid Other

## 2021-09-02 ENCOUNTER — Encounter: Payer: Self-pay | Admitting: Emergency Medicine

## 2021-09-02 DIAGNOSIS — K5792 Diverticulitis of intestine, part unspecified, without perforation or abscess without bleeding: Secondary | ICD-10-CM | POA: Insufficient documentation

## 2021-09-02 DIAGNOSIS — N390 Urinary tract infection, site not specified: Secondary | ICD-10-CM | POA: Insufficient documentation

## 2021-09-02 DIAGNOSIS — R1031 Right lower quadrant pain: Secondary | ICD-10-CM | POA: Diagnosis not present

## 2021-09-02 DIAGNOSIS — D72829 Elevated white blood cell count, unspecified: Secondary | ICD-10-CM | POA: Insufficient documentation

## 2021-09-02 LAB — URINALYSIS, ROUTINE W REFLEX MICROSCOPIC
Bilirubin Urine: NEGATIVE
Glucose, UA: NEGATIVE mg/dL
Hgb urine dipstick: NEGATIVE
Ketones, ur: NEGATIVE mg/dL
Nitrite: NEGATIVE
Protein, ur: NEGATIVE mg/dL
Specific Gravity, Urine: 1.017 (ref 1.005–1.030)
WBC, UA: >50 WBC/hpf — ABNORMAL HIGH (ref 0–5)
pH: 6 (ref 5.0–8.0)

## 2021-09-02 MED ORDER — PROMETHAZINE HCL 12.5 MG PO TABS: 12.5000 mg | ORAL_TABLET | Freq: Three times a day (TID) | ORAL | 0 refills | Status: AC | PRN

## 2021-09-02 MED ORDER — HYDROCODONE-ACETAMINOPHEN 5-325 MG PO TABS
1.0000 | ORAL_TABLET | Freq: Once | ORAL | Status: AC
  Administered 2021-09-02: 1 via ORAL
  Filled 2021-09-02: qty 1

## 2021-09-02 MED ORDER — IOHEXOL 300 MG/ML  SOLN
100.0000 mL | Freq: Once | INTRAMUSCULAR | Status: AC | PRN
  Administered 2021-09-02: 75 mL via INTRAVENOUS

## 2021-09-02 MED ORDER — HYDROCODONE-ACETAMINOPHEN 5-325 MG PO TABS
1.0000 | ORAL_TABLET | ORAL | 0 refills | Status: AC | PRN
Start: 2021-09-02 — End: 2021-09-09

## 2021-09-02 MED ORDER — CIPROFLOXACIN HCL 500 MG PO TABS: 500.0000 mg | ORAL_TABLET | Freq: Two times a day (BID) | ORAL | 0 refills | Status: AC

## 2021-09-02 MED ORDER — METRONIDAZOLE 500 MG PO TABS: 500.0000 mg | ORAL_TABLET | Freq: Three times a day (TID) | ORAL | 0 refills | Status: AC

## 2021-09-02 MED ORDER — ONDANSETRON 4 MG PO TBDP
4.0000 mg | ORAL_TABLET | Freq: Once | ORAL | Status: AC
  Administered 2021-09-02: 4 mg via ORAL
  Filled 2021-09-02: qty 1

## 2021-09-02 NOTE — ED Provider Triage Note (Signed)
  Emergency Medicine Provider Triage Evaluation Note  Marianita Botkin , a 26 y.o.female,  was evaluated in triage.  Pt complains of lower abdominal pain.  Reports persistent pain in both her right and left lower quadrants with some emesis and painful urination.  Additionally states that she feels that her lower abdomen is more bloated than usual. she was initially evaluated here yesterday, but left without being seen due to the wait time.   Review of Systems  Positive: Abdominal pain, nausea Negative: Denies fever, chest pain, vomiting  Physical Exam   Vitals:   09/02/21 1433  BP: 112/71  Pulse: 100  Resp: 16  Temp: 98.7 F (37.1 C)  SpO2: 100%   Gen:   Awake, no distress   Resp:  Normal effort  MSK:   Moves extremities without difficulty  Other:  Tenderness diffusely along the right and left abdominal quadrants, particular on the right side.  Patient does have some lower abdominal bloating  Medical Decision Making  Given the patient's initial medical screening exam, the following diagnostic evaluation has been ordered. The patient will be placed in the appropriate treatment space, once one is available, to complete the evaluation and treatment. I have discussed the plan of care with the patient and I have advised the patient that an ED physician or mid-level practitioner will reevaluate their condition after the test results have been received, as the results may give them additional insight into the type of treatment they may need.    Diagnostics: Abdominal/pelvic CT  Treatments: Hydrocodone/acetaminophen, ondansetron   Varney Daily, [place] 09/02/21 1449

## 2021-09-02 NOTE — ED Provider Notes (Signed)
[facility] Provider Note    Event Date/Time   First MD Initiated Contact with Patient 09/02/21 1553     (approximate)   History   Chief Complaint Abdominal Pain   HPI [name] [name] is a 26 y.o. female, history of anemia, ovarian cysts, presents to the emergency department for evaluation of lower abdominal pain x7 days.  She states that she thought it was a UTI at first, that she was having burning sensation and foul-smelling urine for the first couple days.  However, now she is having increased pain in her left and right lower abdomen, often radiating back-and-forth.  In addition endorses some nausea as well.  Denies fever/chills, chest pain, shortness of breath, flank pain, vomiting, diarrhea, hematemesis, hematochezia, vaginal bleeding, dizziness/lightheadedness, or rash/lesions.  History Limitations: No limitations.        Physical Exam  Triage Vital Signs: ED Triage Vitals  Enc Vitals Group     BP 09/02/21 1433 112/71     Pulse Rate 09/02/21 1433 100     Resp 09/02/21 1433 16     Temp 09/02/21 1433 98.7 F (37.1 C)     Temp src --      SpO2 09/02/21 1433 100 %     Weight 09/02/21 1434 111 lb 15.9 oz (50.8 kg)     Height 09/02/21 1434 5\' 1"  (1.549 m)     Head Circumference --      Peak Flow --      Pain Score 09/02/21 1433 8     Pain Loc --      Pain Edu? --      Excl. in [place]? --     Most recent vital signs: Vitals:   09/02/21 1433 09/02/21 1657  BP: 112/71 (!) 102/55  Pulse: 100 66  Resp: 16 18  Temp: 98.7 F (37.1 C)   SpO2: 100% 97%    General: Awake, NAD.  Skin: Warm, dry. No rashes or lesions.  Eyes: PERRL. Conjunctivae normal.  CV: Good peripheral perfusion.  Resp: Normal effort.  Neuro: At baseline. No gross neurological deficits.   Focused Exam: Mild tenderness diffusely across the lower abdomen.  Small amount of bloating.  Physical Exam    ED Results / Procedures / Treatments  Labs (all labs ordered are listed,  but only abnormal results are displayed) Labs Reviewed  URINE CULTURE     EKG N/A.   RADIOLOGY  ED Provider Interpretation: I personally viewed and interpreted the CT scan.  Evidence suggestive of diverticulitis in the mid/distal ascending colon.  CT Abdomen Pelvis W Contrast  Result Date: 09/02/2021 CLINICAL DATA:  Right lower quadrant pain, left lower quadrant pain. Nausea and pain with urination. EXAM: CT ABDOMEN AND PELVIS WITH CONTRAST TECHNIQUE: Multidetector CT imaging of the abdomen and pelvis was performed using the standard protocol following bolus administration of intravenous contrast. RADIATION DOSE REDUCTION: This exam was performed according to the departmental dose-optimization program which includes automated exposure control, adjustment of the mA and/or kV according to patient size and/or use of iterative reconstruction technique. CONTRAST:  37mL OMNIPAQUE IOHEXOL 300 MG/ML  SOLN COMPARISON:  08/10/2013. FINDINGS: Lower chest: Lung bases are clear. Heart size normal. No pericardial or pleural effusion. Distal esophagus is grossly unremarkable. Hepatobiliary: Liver and gallbladder are unremarkable. No biliary ductal dilatation. Pancreas: Negative. Spleen: Negative. Adrenals/Urinary Tract: Adrenal glands and kidneys are unremarkable. Ureters are decompressed. Bladder is grossly unremarkable. Stomach/Bowel: Stomach and small bowel are unremarkable. Appendix is poorly visualized. When  compared with 08/10/2013, the appendix is located along the medial aspect of the cecum. Referencing that area on today's scan, there is no inflammatory stranding. Majority of inflammatory stranding and fluid are seen adjacent to the mid/distal ascending colon, where there is at least 1 diverticulum (5/42). Stool is seen in the majority of the colon. Vascular/Lymphatic: Vascular structures are unremarkable. No pathologically enlarged lymph nodes. Reproductive: Uterus is visualized.  No adnexal mass. Other:  Small pelvic free fluid. Trace ascites adjacent to the posterior right hepatic lobe. Mesenteries and peritoneum are otherwise unremarkable. Musculoskeletal: Negative. IMPRESSION: 1. Inflammatory stranding and fluid is centered along the mid/distal ascending colon, where there is at least 1 diverticulum, indicative of diverticulitis. Appendix is not well visualized. Difficult to definitively exclude appendicitis but there is no inflammatory stranding in the pericecal region, making appendicitis less likely. 2. Small ascites. Electronically Signed   By: Lorin Picket M.D.   On: 09/02/2021 15:33    PROCEDURES:  Critical Care performed: N/A.  Procedures    MEDICATIONS ORDERED IN ED: Medications  HYDROcodone-acetaminophen (NORCO/VICODIN) 5-325 MG per tablet 1 tablet (1 tablet Oral Given 09/02/21 1455)  ondansetron (ZOFRAN-ODT) disintegrating tablet 4 mg (4 mg Oral Given 09/02/21 1455)  iohexol (OMNIPAQUE) 300 MG/ML solution 100 mL (75 mLs Intravenous Contrast Given 09/02/21 1512)     IMPRESSION / MDM / ASSESSMENT AND PLAN / ED COURSE  I reviewed the triage vital signs and the nursing notes.                              Differential diagnosis includes, but is not limited to, cystitis, diverticulitis, appendicitis, pyelonephritis, nephrolithiasis, ureterolithiasis.  ED Course Patient appears well, vitals within normal limits.  Currently afebrile.  Endorsing mild-moderate pain.  We will go ahead treat with hydrocodone/acetaminophen.  Reviewed lab results from yesterday:  CBC shows mild leukocytosis at 12.0.  No clinically significant anemia.  CMP shows no significant electrolyte abnormalities, AKI, or transaminitis.  Urinalysis shows small leukocytes, greater than 50 WBC, and rare bacteria.  Given her history, suggestive of infection.  Assessment/Plan Patient presents with lower abdominal discomfort x7 days.  Physical exam shows mild tenderness, but is otherwise unimpressive.  Lab work-up  shows slight leukocytosis, but otherwise unremarkable.  CT scan does show evidence of diverticulitis in the mid/distal ascending colon.  The appendix is not visualized well, however appears unlikely given her CT findings and given her clinical presentation.  Urinalysis is suggestive of possible UTI.  Will provide coverage for both diverticulitis and UTI with ciprofloxacin/metronidazole.  We will additionally provide her with antiemetics and pain medicine as well.  We will provide her with a referral to gastroenterology for follow-up colonoscopy.  We will plan to discharge.  Encouraged her to follow-up with her primary care provider within the next week to ensure improvement in symptoms.  Patient's presentation is most consistent with acute complicated illness / injury requiring diagnostic workup.   Considered admission for this patient, but given her stable presentation, she is unlikely to benefit from admission.  Provided the patient with anticipatory guidance, return precautions, and educational material. Encouraged the patient to return to the emergency department at any time if they begin to experience any new or worsening symptoms. Patient expressed understanding and agreed with the plan.       FINAL CLINICAL IMPRESSION(S) / ED DIAGNOSES   Final diagnoses:  Diverticulitis  Urinary tract infection without hematuria, site unspecified  Rx / DC Orders   ED Discharge Orders          Ordered    ciprofloxacin (CIPRO) 500 MG tablet  2 times daily        09/02/21 1635    metroNIDAZOLE (FLAGYL) 500 MG tablet  3 times daily        09/02/21 1635    promethazine (PHENERGAN) 12.5 MG tablet  Every 8 hours PRN        09/02/21 1635    HYDROcodone-acetaminophen (NORCO/VICODIN) 5-325 MG tablet  Every 4 hours PRN        09/02/21 1635             Note:  This document was prepared using Dragon voice recognition software and may include unintentional dictation errors.   Teodoro Spray, [place] 09/02/21 1907    Vanessa Sequoyah, MD 09/03/21 [phone]

## 2021-09-02 NOTE — ED Triage Notes (Signed)
Arrives with persistent pain to right and left lower quadrants.  Denies vomiting and diarrhea.  C/O nausea and pain with urination.

## 2021-09-02 NOTE — Discharge Instructions (Addendum)
-  Take all of your antibiotics as prescribed.  -You may treat the nausea with the promethazine prescribed.  Treat pain with ibuprofen/Tylenol as needed.  Utilize hydrocodone sparingly.  -Follow-up with your primary care provider within the next 7 days to ensure improvement in symptoms.  -Call tomorrow and schedule appointment with the gastroenterologist listed in these instructions.  -Return to the emergency department anytime if you begin to experience any new or worsening symptoms.  -This is the recommended diet plan for diverticulitis:  Your diet starts with only clear liquids for a few days. Examples of items allowed on a clear liquid diet include:  Broth Fruit juices without pulp, such as apple juice Ice chips Ice pops without bits of fruit or fruit pulp Gelatin Water Tea or coffee without cream As you start feeling better, your doctor will recommend that you slowly add low-fiber foods. Examples of low-fiber foods include:  Canned or cooked fruits without skin or seeds Canned or cooked vegetables such as green beans, carrots and potatoes (without the skin) Eggs, fish and poultry Refined white bread Fruit and vegetable juice with no pulp Low-fiber cereals Milk, yogurt and cheese White rice, pasta and noodles

## 2021-09-02 NOTE — ED Triage Notes (Signed)
Presented to ED last night for same, LWBS>

## 2021-09-04 LAB — URINE CULTURE

## 2021-09-05 LAB — URINE CULTURE: Culture: >=100000 — AB

## 2022-03-01 DIAGNOSIS — Z03818 Encounter for observation for suspected exposure to other biological agents ruled out: Secondary | ICD-10-CM | POA: Diagnosis not present

## 2022-03-01 DIAGNOSIS — R110 Nausea: Secondary | ICD-10-CM | POA: Diagnosis not present

## 2022-04-22 ENCOUNTER — Ambulatory Visit
Admission: EM | Admit: 2022-04-22 | Discharge: 2022-04-22 | Disposition: A | Discharge status: Home / Self Care | Payer: Medicaid Other

## 2022-04-22 DIAGNOSIS — J069 Acute upper respiratory infection, unspecified: Secondary | ICD-10-CM

## 2022-04-22 NOTE — ED Triage Notes (Signed)
Pt. Presents to UC w/ c/o nasal congestion and feeling "flushed" yesterday. Pt. States she just needs a note to return to work.

## 2022-04-22 NOTE — ED Provider Notes (Signed)
Roderic Palau    CSN: [id number] Arrival date & time: 04/22/22  Magazine      History   Chief Complaint Chief Complaint  Patient presents with   Nasal Congestion    HPI [name] [name] is a 27 y.o. female.   HPI  Patient presents urgent care with complaint of nasal congestion and feeling flushed yesterday.  She needs a note for work.  Past Medical History:  Diagnosis Date   Anemia    Breast mass    H/O rubella    H/O varicella    Hx of ovarian cyst    Preeclampsia, severe    with 1st pregnancy   Yeast infection     Patient Active Problem List   Diagnosis Date Noted   Encounter for sterilization 08/26/2017   Postpartum care following vaginal delivery 08/26/2017   Abdominal cramping affecting pregnancy 08/03/2017   Anemia of mother in pregnancy 06/17/2017   Migraine 06/13/2017   Nausea and vomiting during pregnancy 01/20/2017   Pregnancy, supervision, high-risk 01/10/2017   History of gestational hypertension 11/28/2011    Past Surgical History:  Procedure Laterality Date   AUGMENTATION MAMMAPLASTY     NO PAST SURGERIES     TUBAL LIGATION Bilateral 08/26/2017   Procedure: POST PARTUM TUBAL LIGATION;  Surgeon: Will Bonnet, MD;  Location: [facility]S;  Service: Gynecology;  Laterality: Bilateral;    OB History     Gravida  3   Para  3   Term  3   Preterm  0   AB  0   Living  3      SAB  0   IAB  0   Ectopic  0   Multiple  0   Live Births  3            Home Medications    Prior to Admission medications   Medication Sig Start Date End Date Taking? Authorizing Provider  ibuprofen (ADVIL,MOTRIN) 600 MG tablet Take 1 tablet (600 mg total) by mouth every 6 (six) hours. 08/27/17   Rod Can, CNM  Prenatal Vit-Fe Fumarate-FA (PRENATAL VITAMIN PO) Take by mouth.    [provider]  promethazine (PHENERGAN) 12.5 MG tablet Take 1 tablet (12.5 mg total) by mouth every 8 (eight) hours as needed for nausea or vomiting. 09/02/21    Teodoro Spray, PA    Family History Family History  Problem Relation Age of Onset   Alcohol abuse Mother    Depression Mother    Alcohol abuse Father    Diabetes Maternal Grandmother    Breast cancer Maternal Grandmother    Mental retardation Cousin    Autism Cousin    Breast cancer Maternal Aunt     Social History Social History   Tobacco Use   Smoking status: Never   Smokeless tobacco: Never  Substance Use Topics   Alcohol use: No   Drug use: No     Allergies   Penicillins   Review of Systems Review of Systems   Physical Exam Triage Vital Signs ED Triage Vitals  Enc Vitals Group     BP      Pulse      Resp      Temp      Temp src      SpO2      Weight      Height      Head Circumference      Peak Flow      Pain Score  Pain Loc      Pain Edu?      Excl. in [place]?    No data found.  Updated Vital Signs There were no vitals taken for this visit.  Visual Acuity Right Eye Distance:   Left Eye Distance:   Bilateral Distance:    Right Eye Near:   Left Eye Near:    Bilateral Near:     Physical Exam Vitals reviewed.  Constitutional:      Appearance: Normal appearance. She is not ill-appearing.  HENT:     Nose: Congestion and rhinorrhea present.     Mouth/Throat:     Pharynx: No oropharyngeal exudate or posterior oropharyngeal erythema.  Cardiovascular:     Rate and Rhythm: Normal rate and regular rhythm.     Pulses: Normal pulses.     Heart sounds: Normal heart sounds.  Pulmonary:     Effort: Pulmonary effort is normal.     Breath sounds: Normal breath sounds.  Skin:    General: Skin is warm and dry.  Neurological:     General: No focal deficit present.     Mental Status: She is alert and oriented to person, place, and time.  Psychiatric:        Mood and Affect: Mood normal.        Behavior: Behavior normal.      UC Treatments / Results  Labs (all labs ordered are listed, but only abnormal results are displayed) Labs  Reviewed - No data to display  EKG   Radiology No results found.  Procedures Procedures (including critical care time)  Medications Ordered in UC Medications - No data to display  Initial Impression / Assessment and Plan / UC Course  I have reviewed the triage vital signs and the nursing notes.  Pertinent labs & imaging results that were available during my care of the patient were reviewed by me and considered in my medical decision making (see chart for details).   Patient is afebrile here without recent antipyretics. Satting well on room air. Overall is well appearing, well hydrated, without respiratory distress. Pulmonary exam is unremarkable.  Lungs CTAB without wheezing, rhonchi, rales.  Pharyngeal erythema or peritonsillar exudates.  Nasal congestion is present with rhinorrhea.  Likely viral etiology for her symptoms.  Recommending use of OTC medication for symptom control.  Final Clinical Impressions(s) / UC Diagnoses   Final diagnoses:  None   Discharge Instructions   None    ED Prescriptions   None    PDMP not reviewed this encounter.   [name], [place] 04/22/22 1850

## 2022-04-22 NOTE — Discharge Instructions (Addendum)
Follow up here or with your primary care provider if your symptoms are worsening or not improving.     

## 2022-06-12 DIAGNOSIS — J029 Acute pharyngitis, unspecified: Secondary | ICD-10-CM | POA: Diagnosis not present

## 2022-06-12 DIAGNOSIS — R432 Parageusia: Secondary | ICD-10-CM | POA: Diagnosis not present

## 2022-06-12 DIAGNOSIS — J019 Acute sinusitis, unspecified: Secondary | ICD-10-CM | POA: Diagnosis not present

## 2022-06-12 DIAGNOSIS — R059 Cough, unspecified: Secondary | ICD-10-CM | POA: Diagnosis not present

## 2022-06-12 DIAGNOSIS — Z20822 Contact with and (suspected) exposure to covid-19: Secondary | ICD-10-CM | POA: Diagnosis not present

## 2022-10-07 ENCOUNTER — Ambulatory Visit: Payer: Self-pay

## 2022-12-14 ENCOUNTER — Ambulatory Visit: Payer: Self-pay

## 2022-12-23 ENCOUNTER — Ambulatory Visit: Payer: Self-pay

## 2023-03-15 ENCOUNTER — Ambulatory Visit: Payer: Self-pay

## 2023-03-31 ENCOUNTER — Ambulatory Visit
Admission: RE | Admit: 2023-03-31 | Discharge: 2023-03-31 | Disposition: A | Discharge status: Home / Self Care | Payer: Medicaid Other | Source: Ambulatory Visit | Attending: Emergency Medicine | Admitting: Emergency Medicine

## 2023-03-31 VITALS — BP 124/77 | HR 97 | Temp 99.2°F | Resp 18

## 2023-03-31 DIAGNOSIS — J069 Acute upper respiratory infection, unspecified: Secondary | ICD-10-CM

## 2023-03-31 MED ORDER — PREDNISONE 10 MG (21) PO TBPK: ORAL_TABLET | Freq: Every day | ORAL | 0 refills | Status: DC

## 2023-03-31 NOTE — ED Provider Notes (Signed)
 [name]    CSN: [id number] Arrival date & time: 03/31/23  1543      History   Chief Complaint Chief Complaint  Patient presents with   Cough    Entered by patient    HPI [name] [name] is a 28 y.o. female.  Patient presents with 1 week history of cough.  Her cough is worse at night.  It is occasionally productive of clear sputum.  She has been taking Mucinex .  No fever or shortness of breath.  She denies history of lung disease.  The history is provided by the patient and medical records.    Past Medical History:  Diagnosis Date   Anemia    Breast mass    H/O rubella    H/O varicella    Hx of ovarian cyst    Preeclampsia, severe    with 1st pregnancy   Yeast infection     Patient Active Problem List   Diagnosis Date Noted   Encounter for sterilization 08/26/2017   Postpartum care following vaginal delivery 08/26/2017   Abdominal cramping affecting pregnancy 08/03/2017   Anemia of mother in pregnancy 06/17/2017   Migraine 06/13/2017   Nausea and vomiting during pregnancy 01/20/2017   Pregnancy, supervision, high-risk 01/10/2017   History of gestational hypertension 11/28/2011    Past Surgical History:  Procedure Laterality Date   AUGMENTATION MAMMAPLASTY     NO PAST SURGERIES     TUBAL LIGATION Bilateral 08/26/2017   Procedure: POST PARTUM TUBAL LIGATION;  Surgeon: Leonce Garnette BIRCH, MD;  Location: [facility];  Service: Gynecology;  Laterality: Bilateral;    OB History     Gravida  3   Para  3   Term  3   Preterm  0   AB  0   Living  3      SAB  0   IAB  0   Ectopic  0   Multiple  0   Live Births  3            Home Medications    Prior to Admission medications   Medication Sig Start Date End Date Taking? Authorizing Provider  predniSONE  (STERAPRED UNI-PAK 21 TAB) 10 MG (21) TBPK tablet Take by mouth daily. As directed 03/31/23  Yes Corlis Burnard DEL, NP  ibuprofen  (ADVIL ,MOTRIN ) 600 MG tablet Take 1 tablet (600 mg total) by  mouth every 6 (six) hours. Patient not taking: Reported on 03/31/2023 08/27/17   Lynda Bradley, CNM  Prenatal Vit-Fe Fumarate-FA (PRENATAL VITAMIN PO) Take by mouth. Patient not taking: Reported on 03/31/2023    [provider]  promethazine  (PHENERGAN ) 12.5 MG tablet Take 1 tablet (12.5 mg total) by mouth every 8 (eight) hours as needed for nausea or vomiting. Patient not taking: Reported on 03/31/2023 09/02/21   Antonetta Penne Ruth, PA    Family History Family History  Problem Relation Age of Onset   Alcohol abuse Mother    Depression Mother    Alcohol abuse Father    Diabetes Maternal Grandmother    Breast cancer Maternal Grandmother    Mental retardation Cousin    Autism Cousin    Breast cancer Maternal Aunt     Social History Social History   Tobacco Use   Smoking status: Never   Smokeless tobacco: Never  Substance Use Topics   Alcohol use: No   Drug use: No     Allergies   Penicillins   Review of Systems Review of Systems  Constitutional:  Negative for chills and fever.  HENT:  Negative for ear pain and sore throat.   Respiratory:  Positive for cough. Negative for shortness of breath.      Physical Exam Triage Vital Signs ED Triage Vitals  Encounter Vitals Group     BP 03/31/23 1556 124/77     Systolic BP Percentile --      Diastolic BP Percentile --      Pulse Rate 03/31/23 1556 97     Resp 03/31/23 1556 18     Temp 03/31/23 1556 99.2 F (37.3 C)     Temp src --      SpO2 03/31/23 1556 99 %     Weight --      Height --      Head Circumference --      Peak Flow --      Pain Score 03/31/23 1607 0     Pain Loc --      Pain Education --      Exclude from Growth Chart --    No data found.  Updated Vital Signs BP 124/77   Pulse 97   Temp 99.2 F (37.3 C)   Resp 18   LMP 03/31/2023   SpO2 99%   Visual Acuity Right Eye Distance:   Left Eye Distance:   Bilateral Distance:    Right Eye Near:   Left Eye Near:    Bilateral Near:      Physical Exam Constitutional:      General: She is not in acute distress. HENT:     Right Ear: Tympanic membrane normal.     Left Ear: Tympanic membrane normal.     Nose: Nose normal.     Mouth/Throat:     Mouth: Mucous membranes are moist.     Pharynx: Oropharynx is clear.  Cardiovascular:     Rate and Rhythm: Normal rate and regular rhythm.     Heart sounds: Normal heart sounds.  Pulmonary:     Effort: Pulmonary effort is normal. No respiratory distress.     Breath sounds: Normal breath sounds.  Neurological:     Mental Status: She is alert.      UC Treatments / Results  Labs (all labs ordered are listed, but only abnormal results are displayed) Labs Reviewed - No data to display  EKG   Radiology No results found.  Procedures Procedures (including critical care time)  Medications Ordered in UC Medications - No data to display  Initial Impression / Assessment and Plan / UC Course  I have reviewed the triage vital signs and the nursing notes.  Pertinent labs & imaging results that were available during my care of the patient were reviewed by me and considered in my medical decision making (see chart for details).    Viral URI with cough.  Lungs are clear and O2 sat is 99% on room air.  Patient has been symptomatic for a week.  Treating today with prednisone  taper.  Education provided on viral respiratory infection.  Instructed patient to follow-up with her PCP if she is not improving.  She agrees to plan of care.  Final Clinical Impressions(s) / UC Diagnoses   Final diagnoses:  Viral URI with cough     Discharge Instructions      Take the prednisone  as directed.  Follow-up with your primary care provider if your symptoms are not improving.      ED Prescriptions     Medication Sig Dispense Auth. Provider  predniSONE  (STERAPRED UNI-PAK 21 TAB) 10 MG (21) TBPK tablet Take by mouth daily. As directed 21 tablet Corlis Burnard DEL, NP      PDMP not  reviewed this encounter.   Corlis Burnard DEL, NP 03/31/23 [phone]

## 2023-03-31 NOTE — Discharge Instructions (Addendum)
Take the prednisone as directed.  Follow up with your primary care provider if your symptoms are not improving.    

## 2023-03-31 NOTE — ED Triage Notes (Signed)
 Patient to Urgent Care with complaints of cough. Dry at night and productive during the day. Unable to sleep at night due to cough. Denies any known fevers.   Symptoms x1 week.   Unable to tolerate nyquil/ liquid cold and flu meds Taking Mucinex.

## 2023-04-18 ENCOUNTER — Ambulatory Visit
Admission: RE | Admit: 2023-04-18 | Discharge: 2023-04-18 | Disposition: A | Discharge status: Home / Self Care | Payer: Medicaid Other | Source: Ambulatory Visit | Attending: Emergency Medicine | Admitting: Emergency Medicine

## 2023-04-18 VITALS — BP 120/77 | HR 83 | Temp 99.2°F | Resp 18

## 2023-04-18 DIAGNOSIS — J069 Acute upper respiratory infection, unspecified: Secondary | ICD-10-CM | POA: Diagnosis not present

## 2023-04-18 MED ORDER — AZITHROMYCIN 250 MG PO TABS
250.0000 mg | ORAL_TABLET | Freq: Every day | ORAL | 0 refills | Status: AC
Start: 2023-04-18 — End: ?

## 2023-04-18 NOTE — ED Provider Notes (Signed)
Renaldo Fiddler    CSN: [id number] Arrival date & time: 04/18/23  1752      History   Chief Complaint Chief Complaint  Patient presents with   Cough    Entered by patient    HPI [name] [name] is a 28 y.o. female.  Patient presents with ongoing cough x 2 weeks.  No fever, sore throat, shortness of breath.  Treating symptoms with Mucinex.  Patient was seen here on 03/31/2023; diagnosed with viral URI with cough; treated with prednisone taper.  She finished the prednisone but her cough has persisted.  The history is provided by the patient and medical records.    Past Medical History:  Diagnosis Date   Anemia    Breast mass    H/O rubella    H/O varicella    Hx of ovarian cyst    Preeclampsia, severe    with 1st pregnancy   Yeast infection     Patient Active Problem List   Diagnosis Date Noted   Encounter for sterilization 08/26/2017   Postpartum care following vaginal delivery 08/26/2017   Abdominal cramping affecting pregnancy 08/03/2017   Anemia of mother in pregnancy 06/17/2017   Migraine 06/13/2017   Nausea and vomiting during pregnancy 01/20/2017   Pregnancy, supervision, high-risk 01/10/2017   History of gestational hypertension 11/28/2011    Past Surgical History:  Procedure Laterality Date   AUGMENTATION MAMMAPLASTY     NO PAST SURGERIES     TUBAL LIGATION Bilateral 08/26/2017   Procedure: POST PARTUM TUBAL LIGATION;  Surgeon: Conard Novak, MD;  Location: [facility];  Service: Gynecology;  Laterality: Bilateral;    OB History     Gravida  3   Para  3   Term  3   Preterm  0   AB  0   Living  3      SAB  0   IAB  0   Ectopic  0   Multiple  0   Live Births  3            Home Medications    Prior to Admission medications   Medication Sig Start Date End Date Taking? Authorizing Provider  azithromycin (ZITHROMAX) 250 MG tablet Take 1 tablet (250 mg total) by mouth daily. Take first 2 tablets together, then 1 every day  until finished. 04/18/23  Yes Mickie Bail, NP  ibuprofen (ADVIL,MOTRIN) 600 MG tablet Take 1 tablet (600 mg total) by mouth every 6 (six) hours. Patient not taking: Reported on 03/31/2023 08/27/17   Tresea Mall, CNM  predniSONE (STERAPRED UNI-PAK 21 TAB) 10 MG (21) TBPK tablet Take by mouth daily. As directed 03/31/23   Mickie Bail, NP  Prenatal Vit-Fe Fumarate-FA (PRENATAL VITAMIN PO) Take by mouth. Patient not taking: Reported on 03/31/2023    [provider]  promethazine (PHENERGAN) 12.5 MG tablet Take 1 tablet (12.5 mg total) by mouth every 8 (eight) hours as needed for nausea or vomiting. Patient not taking: Reported on 03/31/2023 09/02/21   Varney Daily, PA    Family History Family History  Problem Relation Age of Onset   Alcohol abuse Mother    Depression Mother    Alcohol abuse Father    Diabetes Maternal Grandmother    Breast cancer Maternal Grandmother    Mental retardation Cousin    Autism Cousin    Breast cancer Maternal Aunt     Social History Social History   Tobacco Use   Smoking status: Never  Smokeless tobacco: Never  Substance Use Topics   Alcohol use: No   Drug use: No     Allergies   Penicillins   Review of Systems Review of Systems  Constitutional:  Negative for chills and fever.  HENT:  Negative for ear pain and sore throat.   Respiratory:  Positive for cough. Negative for shortness of breath and wheezing.   Cardiovascular:  Negative for chest pain and palpitations.     Physical Exam Triage Vital Signs ED Triage Vitals [04/18/23 1805]  Encounter Vitals Group     BP 120/77     Systolic BP Percentile      Diastolic BP Percentile      Pulse Rate 83     Resp 18     Temp 99.2 F (37.3 C)     Temp src      SpO2 99 %     Weight      Height      Head Circumference      Peak Flow      Pain Score      Pain Loc      Pain Education      Exclude from Growth Chart    No data found.  Updated Vital Signs BP 120/77   Pulse 83    Temp 99.2 F (37.3 C)   Resp 18   LMP 03/31/2023   SpO2 99%   Visual Acuity Right Eye Distance:   Left Eye Distance:   Bilateral Distance:    Right Eye Near:   Left Eye Near:    Bilateral Near:     Physical Exam Constitutional:      General: She is not in acute distress. HENT:     Right Ear: Tympanic membrane normal.     Left Ear: Tympanic membrane normal.     Nose: Nose normal.     Mouth/Throat:     Mouth: Mucous membranes are moist.     Pharynx: Oropharynx is clear.  Cardiovascular:     Rate and Rhythm: Normal rate and regular rhythm.     Heart sounds: Normal heart sounds.  Pulmonary:     Effort: Pulmonary effort is normal. No respiratory distress.     Breath sounds: Normal breath sounds.  Neurological:     Mental Status: She is alert.      UC Treatments / Results  Labs (all labs ordered are listed, but only abnormal results are displayed) Labs Reviewed - No data to display  EKG   Radiology No results found.  Procedures Procedures (including critical care time)  Medications Ordered in UC Medications - No data to display  Initial Impression / Assessment and Plan / UC Course  I have reviewed the triage vital signs and the nursing notes.  Pertinent labs & imaging results that were available during my care of the patient were reviewed by me and considered in my medical decision making (see chart for details).    Acute URI.  Patient has been symptomatic for 2 weeks and did not improve with the prednisone taper.  Treating today with Zithromax.  Education provided on upper respiratory infection.  Instructed her to follow-up with her PCP if she is not improving.  She agrees to plan of care.  Final Clinical Impressions(s) / UC Diagnoses   Final diagnoses:  Acute upper respiratory infection     Discharge Instructions      Take the Zithromax as directed.  Follow-up with your primary care provider if your symptoms  are not improving.      ED  Prescriptions     Medication Sig Dispense Auth. Provider   azithromycin (ZITHROMAX) 250 MG tablet Take 1 tablet (250 mg total) by mouth daily. Take first 2 tablets together, then 1 every day until finished. 6 tablet Mickie Bail, NP      PDMP not reviewed this encounter.   Mickie Bail, NP 04/18/23 [phone]

## 2023-04-18 NOTE — Discharge Instructions (Addendum)
Take the Zithromax as directed.  Follow up with your primary care provider if your symptoms are not improving.    

## 2023-04-18 NOTE — ED Triage Notes (Signed)
Provider triage  

## 2023-05-20 ENCOUNTER — Ambulatory Visit: Payer: Self-pay

## 2023-06-20 ENCOUNTER — Encounter: Payer: Self-pay | Admitting: Oncology

## 2023-06-20 ENCOUNTER — Other Ambulatory Visit: Payer: Self-pay

## 2023-06-20 ENCOUNTER — Encounter: Payer: Self-pay | Admitting: Emergency Medicine

## 2023-06-20 ENCOUNTER — Ambulatory Visit
Admission: EM | Admit: 2023-06-20 | Discharge: 2023-06-20 | Disposition: A | Discharge status: Home / Self Care | Payer: Self-pay | Attending: Emergency Medicine | Admitting: Emergency Medicine

## 2023-06-20 DIAGNOSIS — J302 Other seasonal allergic rhinitis: Secondary | ICD-10-CM

## 2023-06-20 LAB — POCT RAPID STREP A (OFFICE): Rapid Strep A Screen: NEGATIVE

## 2023-06-20 MED ORDER — LIDOCAINE VISCOUS HCL 2 % MT SOLN: 15.0000 mL | OROMUCOSAL | 0 refills | Status: AC | PRN

## 2023-06-20 NOTE — Discharge Instructions (Addendum)
 Your evaluated for your sore throat symptoms most likely related to seasonal allergies and drainage  Rapid strep test is negative for bacteria  May gargle and spit lidocaine solution every 4 hours as needed for temporary relief to your throat  Recommend taking daily antihistamine such as Claritin or Zyrtec or similar product to help reduce and dry drainage  For nosebleeding this is a sign of dryness, may attempt use of humidifier, if you do not have 1 may steam up the bathroom and sit inside or bowl a pot of water and however over Moisture back into the airway, may coat nose using petroleum jelly and Q-tip  If symptoms begin to worsen may follow-up for reevaluation

## 2023-06-20 NOTE — ED Triage Notes (Signed)
 Symptoms started 2 days ago.  Has a sore throat and had 3 nose bleeds yesterday.  Denies runny nose, denies cough.    Denies taking medication for symptoms

## 2023-06-20 NOTE — ED Provider Notes (Signed)
 [name] [name]    CSN: [id number] Arrival date & time: 06/20/23  1418      History   Chief Complaint Chief Complaint  Patient presents with   Sore Throat   Epistaxis    HPI [name] [name] is a 28 y.o. female.   Patient presents for evaluation of postnasal drip, sore throat and nosebleeding beginning 2 days ago.  Has had 3 occurrences of nosebleeds 1 day ago.  No known sick contacts.  Denies fever, cough, headaches, ear pain.  Believes to be allergies but like to make sure.  Not attempted treatment.  Past Medical History:  Diagnosis Date   Anemia    Breast mass    H/O rubella    H/O varicella    Hx of ovarian cyst    Preeclampsia, severe    with 1st pregnancy   Yeast infection     Patient Active Problem List   Diagnosis Date Noted   Encounter for sterilization 08/26/2017   Postpartum care following vaginal delivery 08/26/2017   Abdominal cramping affecting pregnancy 08/03/2017   Anemia of mother in pregnancy 06/17/2017   Migraine 06/13/2017   Nausea and vomiting during pregnancy 01/20/2017   Pregnancy, supervision, high-risk 01/10/2017   History of gestational hypertension 11/28/2011    Past Surgical History:  Procedure Laterality Date   AUGMENTATION MAMMAPLASTY     NO PAST SURGERIES     TUBAL LIGATION Bilateral 08/26/2017   Procedure: POST PARTUM TUBAL LIGATION;  Surgeon: Conard Novak, MD;  Location: [facility];  Service: Gynecology;  Laterality: Bilateral;    OB History     Gravida  3   Para  3   Term  3   Preterm  0   AB  0   Living  3      SAB  0   IAB  0   Ectopic  0   Multiple  0   Live Births  3            Home Medications    Prior to Admission medications   Medication Sig Start Date End Date Taking? Authorizing Provider  lidocaine (XYLOCAINE) 2 % solution Use as directed 15 mLs in the mouth or throat as needed. 06/20/23  Yes Czarina Gingras R, NP  azithromycin (ZITHROMAX) 250 MG tablet Take 1 tablet (250 mg  total) by mouth daily. Take first 2 tablets together, then 1 every day until finished. 04/18/23   Mickie Bail, NP  ibuprofen (ADVIL,MOTRIN) 600 MG tablet Take 1 tablet (600 mg total) by mouth every 6 (six) hours. Patient not taking: Reported on 03/31/2023 08/27/17   Tresea Mall, CNM  predniSONE (STERAPRED UNI-PAK 21 TAB) 10 MG (21) TBPK tablet Take by mouth daily. As directed 03/31/23   Mickie Bail, NP  Prenatal Vit-Fe Fumarate-FA (PRENATAL VITAMIN PO) Take by mouth. Patient not taking: Reported on 03/31/2023    [provider]  promethazine (PHENERGAN) 12.5 MG tablet Take 1 tablet (12.5 mg total) by mouth every 8 (eight) hours as needed for nausea or vomiting. Patient not taking: Reported on 03/31/2023 09/02/21   Varney Daily, PA    Family History Family History  Problem Relation Age of Onset   Alcohol abuse Mother    Depression Mother    Alcohol abuse Father    Diabetes Maternal Grandmother    Breast cancer Maternal Grandmother    Breast cancer Maternal Aunt    Mental retardation Cousin    Autism Cousin  Social History Social History   Tobacco Use   Smoking status: Never   Smokeless tobacco: Never  Vaping Use   Vaping status: Some Days  Substance Use Topics   Alcohol use: No   Drug use: No     Allergies   Penicillins   Review of Systems Review of Systems  HENT:  Positive for nosebleeds.      Physical Exam Triage Vital Signs ED Triage Vitals  Encounter Vitals Group     BP 06/20/23 1509 110/78     Systolic BP Percentile --      Diastolic BP Percentile --      Pulse Rate 06/20/23 1509 84     Resp 06/20/23 1509 16     Temp 06/20/23 1509 98.6 F (37 C)     Temp Source 06/20/23 1509 Oral     SpO2 06/20/23 1509 98 %     Weight --      Height --      Head Circumference --      Peak Flow --      Pain Score 06/20/23 1507 0     Pain Loc --      Pain Education --      Exclude from Growth Chart --    No data found.  Updated Vital Signs BP  110/78 (BP Location: Left Arm)   Pulse 84   Temp 98.6 F (37 C) (Oral)   Resp 16   LMP 06/13/2023   SpO2 98%   Visual Acuity Right Eye Distance:   Left Eye Distance:   Bilateral Distance:    Right Eye Near:   Left Eye Near:    Bilateral Near:     Physical Exam Constitutional:      Appearance: Normal appearance.  HENT:     Right Ear: Tympanic membrane, ear canal and external ear normal.     Left Ear: Tympanic membrane, ear canal and external ear normal.     Nose: Congestion present. No rhinorrhea.     Mouth/Throat:     Pharynx: Posterior oropharyngeal erythema present. No oropharyngeal exudate.  Eyes:     Extraocular Movements: Extraocular movements intact.  Pulmonary:     Effort: Pulmonary effort is normal.  Musculoskeletal:     Cervical back: Normal range of motion.  Lymphadenopathy:     Cervical: Cervical adenopathy present.  Neurological:     Mental Status: She is alert and oriented to person, place, and time.      UC Treatments / Results  Labs (all labs ordered are listed, but only abnormal results are displayed) Labs Reviewed  POCT RAPID STREP A (OFFICE)    EKG   Radiology No results found.  Procedures Procedures (including critical care time)  Medications Ordered in UC Medications - No data to display  Initial Impression / Assessment and Plan / UC Course  I have reviewed the triage vital signs and the nursing notes.  Pertinent labs & imaging results that were available during my care of the patient were reviewed by me and considered in my medical decision making (see chart for details).  Seasonal Allergies  Vitals are stable, patient in no signs of distress nontoxic-appearing, congestion noted on exam with mild erythema without tonsillar adenopathy or exudate, rapid strep test negative, discussed findings, viscous lidocaine prescribed for comfort and discussed additional supportive measures, recommended daily antihistamine, advised follow-up for  any worsening symptoms, work note given Final Clinical Impressions(s) / UC Diagnoses   Final diagnoses:  Seasonal allergies  Discharge Instructions      Your evaluated for your sore throat symptoms most likely related to seasonal allergies and drainage  Rapid strep test is negative for bacteria  May gargle and spit lidocaine solution every 4 hours as needed for temporary relief to your throat  Recommend taking daily antihistamine such as Claritin or Zyrtec or similar product to help reduce and dry drainage  For nosebleeding this is a sign of dryness, may attempt use of humidifier, if you do not have 1 may steam up the bathroom and sit inside or bowl a pot of water and however over Moisture back into the airway, may coat nose using petroleum jelly and Q-tip  If symptoms begin to worsen may follow-up for reevaluation     ED Prescriptions     Medication Sig Dispense Auth. Provider   lidocaine (XYLOCAINE) 2 % solution Use as directed 15 mLs in the mouth or throat as needed. 100 mL Valinda Hoar, NP      PDMP not reviewed this encounter.   Valinda Hoar, NP 06/20/23 1539

## 2023-06-21 ENCOUNTER — Ambulatory Visit: Payer: Self-pay

## 2023-07-28 ENCOUNTER — Ambulatory Visit: Payer: Self-pay

## 2023-10-24 ENCOUNTER — Ambulatory Visit: Payer: Self-pay

## 2023-10-25 ENCOUNTER — Ambulatory Visit
Admission: RE | Admit: 2023-10-25 | Discharge: 2023-10-25 | Disposition: A | Discharge status: Home / Self Care | Payer: Self-pay | Source: Ambulatory Visit | Attending: Emergency Medicine | Admitting: Emergency Medicine

## 2023-10-25 VITALS — BP 109/75 | HR 93 | Temp 98.6°F | Resp 18

## 2023-10-25 DIAGNOSIS — H60391 Other infective otitis externa, right ear: Secondary | ICD-10-CM

## 2023-10-25 MED ORDER — PREDNISONE 10 MG (21) PO TBPK: ORAL_TABLET | Freq: Every day | ORAL | 0 refills | Status: AC

## 2023-10-25 MED ORDER — OFLOXACIN 0.3 % OT SOLN: 10.0000 [drp] | Freq: Every day | OTIC | 0 refills | Status: AC

## 2023-10-25 NOTE — ED Triage Notes (Signed)
 Patient reports that 3 weeks ago and her right ear was bleeding. Patient states that it suddenly stop. Patient stated after that she started having right ear pain. Patient also states her ear feels numb and is having difficulty hearing out of right ear. Patient states that she has tried OTC ear drops, heating pads and Ibuprofen  with no relief. Patient took 400 mg of Ibuprofen  at 5:00 pm with no relief. Patient currently rates ear pain 5/10.

## 2023-10-25 NOTE — Discharge Instructions (Signed)
 Today you are being treated for an infection of the ear now, on exam there is redness and swelling within the ear canal but no drainage  Place 10 drops of ofloxacin  into the ear every morning for 7 days for coverage for infection  Starting tomorrow take prednisone  every morning with food to reduce inflammation and help with pain, may take Tylenol  additionally  May hold warm compresses to the ear for additional comfort  Please not attempted any ear cleaning or object or fluid placement into the ear canal to prevent further irritation

## 2023-10-27 NOTE — ED Provider Notes (Signed)
 [name] [name]    CSN: [id number] Arrival date & time: 10/25/23  1752      History   Chief Complaint Chief Complaint  Patient presents with   Ear Injury    Entered by patient   Otalgia    HPI [name] [name] is a 28 y.o. female.   Patient presents for evaluation of right-sided ear pain present for 3 weeks.  Initially started as a bloody drainage from the ear which has resolved. since has been experiencing decreased hearing, pain is a sensation of fluid within the ear.   Has attempted to clean, heating pads and ibuprofen  without relief.  Past Medical History:  Diagnosis Date   Anemia    Breast mass    H/O rubella    H/O varicella    Hx of ovarian cyst    Preeclampsia, severe    with 1st pregnancy   Yeast infection     Patient Active Problem List   Diagnosis Date Noted   Encounter for sterilization 08/26/2017   Postpartum care following vaginal delivery 08/26/2017   Abdominal cramping affecting pregnancy 08/03/2017   Anemia of mother in pregnancy 06/17/2017   Migraine 06/13/2017   Nausea and vomiting during pregnancy 01/20/2017   Pregnancy, supervision, high-risk 01/10/2017   History of gestational hypertension 11/28/2011    Past Surgical History:  Procedure Laterality Date   AUGMENTATION MAMMAPLASTY     NO PAST SURGERIES     TUBAL LIGATION Bilateral 08/26/2017   Procedure: POST PARTUM TUBAL LIGATION;  Surgeon: Leonce Garnette BIRCH, MD;  Location: [facility];  Service: Gynecology;  Laterality: Bilateral;    OB History     Gravida  3   Para  3   Term  3   Preterm  0   AB  0   Living  3      SAB  0   IAB  0   Ectopic  0   Multiple  0   Live Births  3            Home Medications    Prior to Admission medications   Medication Sig Start Date End Date Taking? Authorizing Provider  ofloxacin  (FLOXIN ) 0.3 % OTIC solution Place 10 drops into the right ear daily. 10/25/23  Yes Chrystina Naff R, NP  predniSONE  (STERAPRED UNI-PAK 21 TAB) 10  MG (21) TBPK tablet Take by mouth daily. Take 6 tabs by mouth daily  for 1 days, then 5 tabs for 1 days, then 4 tabs for 1 days, then 3 tabs for 1 days, 2 tabs for 1 days, then 1 tab by mouth daily for 1 days 10/25/23  Yes Paytyn Mesta R, NP  azithromycin  (ZITHROMAX ) 250 MG tablet Take 1 tablet (250 mg total) by mouth daily. Take first 2 tablets together, then 1 every day until finished. 04/18/23   Corlis Burnard DEL, NP  ibuprofen  (ADVIL ,MOTRIN ) 600 MG tablet Take 1 tablet (600 mg total) by mouth every 6 (six) hours. Patient not taking: Reported on 03/31/2023 08/27/17   Lynda Bradley, CNM  lidocaine  (XYLOCAINE ) 2 % solution Use as directed 15 mLs in the mouth or throat as needed. 06/20/23   Teresa Shelba SAUNDERS, NP  Prenatal Vit-Fe Fumarate-FA (PRENATAL VITAMIN PO) Take by mouth. Patient not taking: Reported on 03/31/2023    [provider]  promethazine  (PHENERGAN ) 12.5 MG tablet Take 1 tablet (12.5 mg total) by mouth every 8 (eight) hours as needed for nausea or vomiting. Patient not taking: Reported on 03/31/2023 09/02/21  Antonetta Penne Ruth, PA    Family History Family History  Problem Relation Age of Onset   Alcohol abuse Mother    Depression Mother    Alcohol abuse Father    Diabetes Maternal Grandmother    Breast cancer Maternal Grandmother    Breast cancer Maternal Aunt    Mental retardation Cousin    Autism Cousin     Social History Social History   Tobacco Use   Smoking status: Never   Smokeless tobacco: Never  Vaping Use   Vaping status: Some Days  Substance Use Topics   Alcohol use: No   Drug use: No     Allergies   Penicillins   Review of Systems Review of Systems   Physical Exam Triage Vital Signs ED Triage Vitals  Encounter Vitals Group     BP 10/25/23 1822 109/75     Girls Systolic BP Percentile --      Girls Diastolic BP Percentile --      Boys Systolic BP Percentile --      Boys Diastolic BP Percentile --      Pulse Rate 10/25/23 1822 93     Resp  10/25/23 1822 18     Temp 10/25/23 1822 98.6 F (37 C)     Temp Source 10/25/23 1822 Oral     SpO2 10/25/23 1822 100 %     Weight --      Height --      Head Circumference --      Peak Flow --      Pain Score 10/25/23 1819 5     Pain Loc --      Pain Education --      Exclude from Growth Chart --    No data found.  Updated Vital Signs BP 109/75 (BP Location: Left Arm)   Pulse 93   Temp 98.6 F (37 C) (Oral)   Resp 18   LMP 10/12/2023 (Exact Date)   SpO2 100%   Visual Acuity Right Eye Distance:   Left Eye Distance:   Bilateral Distance:    Right Eye Near:   Left Eye Near:    Bilateral Near:     Physical Exam Constitutional:      Appearance: Normal appearance.  HENT:     Left Ear: Tympanic membrane, ear canal and external ear normal.     Ears:     Comments: Erythema and mild to moderate swelling within the right ear canal without drainage present, no abnormality to the tympanic membrane or external ear Eyes:     Extraocular Movements: Extraocular movements intact.  Pulmonary:     Effort: Pulmonary effort is normal.  Neurological:     Mental Status: She is alert and oriented to person, place, and time. Mental status is at baseline.      UC Treatments / Results  Labs (all labs ordered are listed, but only abnormal results are displayed) Labs Reviewed - No data to display  EKG   Radiology No results found.  Procedures Procedures (including critical care time)  Medications Ordered in UC Medications - No data to display  Initial Impression / Assessment and Plan / UC Course  I have reviewed the triage vital signs and the nursing notes.  Pertinent labs & imaging results that were available during my care of the patient were reviewed by me and considered in my medical decision making (see chart for details).  Infective otitis externa of right ear  Presentation concerning for infection, prescribed  ofloxacin  and oral prednisone  for management of  symptoms, recommended nonpharmacological supportive care, advised against further ear cleaning and given precautions that if symptoms do not improve to follow-up for reevaluation Final Clinical Impressions(s) / UC Diagnoses   Final diagnoses:  Infective otitis externa of right ear     Discharge Instructions      Today you are being treated for an infection of the ear now, on exam there is redness and swelling within the ear canal but no drainage  Place 10 drops of ofloxacin  into the ear every morning for 7 days for coverage for infection  Starting tomorrow take prednisone  every morning with food to reduce inflammation and help with pain, may take Tylenol  additionally  May hold warm compresses to the ear for additional comfort  Please not attempted any ear cleaning or object or fluid placement into the ear canal to prevent further irritation    ED Prescriptions     Medication Sig Dispense Auth. Provider   ofloxacin  (FLOXIN ) 0.3 % OTIC solution Place 10 drops into the right ear daily. 5 mL Lakeyta Vandenheuvel R, NP   predniSONE  (STERAPRED UNI-PAK 21 TAB) 10 MG (21) TBPK tablet Take by mouth daily. Take 6 tabs by mouth daily  for 1 days, then 5 tabs for 1 days, then 4 tabs for 1 days, then 3 tabs for 1 days, 2 tabs for 1 days, then 1 tab by mouth daily for 1 days 21 tablet Petr Bontempo, Shelba SAUNDERS, NP      PDMP not reviewed this encounter.   Teresa Shelba SAUNDERS, [place] 10/27/23 [phone]

## 2023-11-08 ENCOUNTER — Ambulatory Visit
Admission: RE | Admit: 2023-11-08 | Discharge: 2023-11-08 | Disposition: A | Discharge status: Home / Self Care | Payer: Self-pay | Source: Ambulatory Visit | Attending: Emergency Medicine | Admitting: Emergency Medicine

## 2023-11-08 VITALS — BP 129/80 | HR 74 | Temp 98.4°F | Resp 18

## 2023-11-08 DIAGNOSIS — J069 Acute upper respiratory infection, unspecified: Secondary | ICD-10-CM

## 2023-11-08 LAB — POC SOFIA SARS ANTIGEN FIA: SARS Coronavirus 2 Ag: NEGATIVE

## 2023-11-08 MED ORDER — CEFDINIR 300 MG PO CAPS: 300.0000 mg | ORAL_CAPSULE | Freq: Two times a day (BID) | ORAL | 0 refills | Status: AC

## 2023-11-08 MED ORDER — GUAIFENESIN-CODEINE 100-10 MG/5ML PO SOLN: 5.0000 mL | Freq: Every evening | ORAL | 0 refills | Status: AC | PRN

## 2023-11-08 NOTE — ED Provider Notes (Signed)
 [name] [name]    CSN: [id number] Arrival date & time: 11/08/23  1306      History   Chief Complaint Chief Complaint  Patient presents with   Cough    Entered by patient   Nasal Congestion    HPI [name] [name] is a 28 y.o. female.   Patient presents for evaluation of fever peaking at one 1.2, nasal congestion,, sore throat and intermittent headaches present for 4 days.  Associated fatigue and malaise.  Sore throat has resolved.  Known exposure to sick contacts and COVID-19 at work.  Decreased appetite but tolerable to some food foods.  Denies shortness of breath or wheezing.  Past Medical History:  Diagnosis Date   Anemia    Breast mass    H/O rubella    H/O varicella    Hx of ovarian cyst    Preeclampsia, severe    with 1st pregnancy   Yeast infection     Patient Active Problem List   Diagnosis Date Noted   Encounter for sterilization 08/26/2017   Postpartum care following vaginal delivery 08/26/2017   Abdominal cramping affecting pregnancy 08/03/2017   Anemia of mother in pregnancy 06/17/2017   Migraine 06/13/2017   Nausea and vomiting during pregnancy 01/20/2017   Pregnancy, supervision, high-risk 01/10/2017   History of gestational hypertension 11/28/2011    Past Surgical History:  Procedure Laterality Date   AUGMENTATION MAMMAPLASTY     NO PAST SURGERIES     TUBAL LIGATION Bilateral 08/26/2017   Procedure: POST PARTUM TUBAL LIGATION;  Surgeon: Leonce Garnette BIRCH, MD;  Location: [facility];  Service: Gynecology;  Laterality: Bilateral;    OB History     Gravida  3   Para  3   Term  3   Preterm  0   AB  0   Living  3      SAB  0   IAB  0   Ectopic  0   Multiple  0   Live Births  3            Home Medications    Prior to Admission medications   Medication Sig Start Date End Date Taking? Authorizing Provider  cefdinir  (OMNICEF ) 300 MG capsule Take 1 capsule (300 mg total) by mouth 2 (two) times daily for 7 days. 11/08/23  11/15/23 Yes Zayveon Raschke R, NP  guaiFENesin -codeine  100-10 MG/5ML syrup Take 5 mLs by mouth at bedtime as needed for cough. 11/08/23  Yes Cataleia Gade R, NP  pseudoephedrine (SUDAFED) 120 MG 12 hr tablet Take 120 mg by mouth 2 (two) times daily.   Yes [provider]  azithromycin  (ZITHROMAX ) 250 MG tablet Take 1 tablet (250 mg total) by mouth daily. Take first 2 tablets together, then 1 every day until finished. 04/18/23   Corlis Burnard DEL, NP  ibuprofen  (ADVIL ,MOTRIN ) 600 MG tablet Take 1 tablet (600 mg total) by mouth every 6 (six) hours. Patient not taking: Reported on 03/31/2023 08/27/17   Lynda Bradley, CNM  lidocaine  (XYLOCAINE ) 2 % solution Use as directed 15 mLs in the mouth or throat as needed. 06/20/23   Teresa Shelba SAUNDERS, NP  ofloxacin  (FLOXIN ) 0.3 % OTIC solution Place 10 drops into the right ear daily. 10/25/23   Aishah Teffeteller, Shelba SAUNDERS, NP  predniSONE  (STERAPRED UNI-PAK 21 TAB) 10 MG (21) TBPK tablet Take by mouth daily. Take 6 tabs by mouth daily  for 1 days, then 5 tabs for 1 days, then 4 tabs for 1 days, then  3 tabs for 1 days, 2 tabs for 1 days, then 1 tab by mouth daily for 1 days 10/25/23   Teresa Shelba SAUNDERS, NP  Prenatal Vit-Fe Fumarate-FA (PRENATAL VITAMIN PO) Take by mouth. Patient not taking: Reported on 03/31/2023    [provider]  promethazine  (PHENERGAN ) 12.5 MG tablet Take 1 tablet (12.5 mg total) by mouth every 8 (eight) hours as needed for nausea or vomiting. Patient not taking: Reported on 03/31/2023 09/02/21   Antonetta Penne Ruth, PA    Family History Family History  Problem Relation Age of Onset   Alcohol abuse Mother    Depression Mother    Alcohol abuse Father    Diabetes Maternal Grandmother    Breast cancer Maternal Grandmother    Breast cancer Maternal Aunt    Mental retardation Cousin    Autism Cousin     Social History Social History   Tobacco Use   Smoking status: Never   Smokeless tobacco: Never  Vaping Use   Vaping status: Some Days   Substance Use Topics   Alcohol use: No   Drug use: No     Allergies   Penicillins   Review of Systems Review of Systems  Respiratory:  Positive for cough.      Physical Exam Triage Vital Signs ED Triage Vitals  Encounter Vitals Group     BP 11/08/23 1334 129/80     Girls Systolic BP Percentile --      Girls Diastolic BP Percentile --      Boys Systolic BP Percentile --      Boys Diastolic BP Percentile --      Pulse Rate 11/08/23 1334 74     Resp 11/08/23 1334 18     Temp 11/08/23 1334 98.4 F (36.9 C)     Temp Source 11/08/23 1334 Oral     SpO2 11/08/23 1332 97 %     Weight --      Height --      Head Circumference --      Peak Flow --      Pain Score 11/08/23 1332 0     Pain Loc --      Pain Education --      Exclude from Growth Chart --    No data found.  Updated Vital Signs BP 129/80 (BP Location: Left Arm)   Pulse 74   Temp 98.4 F (36.9 C) (Oral)   Resp 18   LMP 10/12/2023 (Exact Date)   SpO2 97%   Visual Acuity Right Eye Distance:   Left Eye Distance:   Bilateral Distance:    Right Eye Near:   Left Eye Near:    Bilateral Near:     Physical Exam Constitutional:      Appearance: Normal appearance.  HENT:     Right Ear: Ear canal and external ear normal.     Left Ear: Tympanic membrane, ear canal and external ear normal.     Ears:     Comments: Erythema affecting approximately 50% of the right tympanic membrane, no abnormality to the canal or external    Nose: Congestion present.     Mouth/Throat:     Pharynx: No oropharyngeal exudate or posterior oropharyngeal erythema.  Eyes:     Extraocular Movements: Extraocular movements intact.  Cardiovascular:     Rate and Rhythm: Normal rate and regular rhythm.     Pulses: Normal pulses.     Heart sounds: Normal heart sounds.  Pulmonary:  Effort: Pulmonary effort is normal.     Breath sounds: Normal breath sounds.  Musculoskeletal:     Cervical back: Normal range of motion and neck  supple.  Neurological:     Mental Status: She is alert and oriented to person, place, and time. Mental status is at baseline.      UC Treatments / Results  Labs (all labs ordered are listed, but only abnormal results are displayed) Labs Reviewed  POC SOFIA SARS ANTIGEN FIA    EKG   Radiology No results found.  Procedures Procedures (including critical care time)  Medications Ordered in UC Medications - No data to display  Initial Impression / Assessment and Plan / UC Course  I have reviewed the triage vital signs and the nursing notes.  Pertinent labs & imaging results that were available during my care of the patient were reviewed by me and considered in my medical decision making (see chart for details).  Acute URI  Patient is in no signs of distress nor toxic appearing.  Vital signs are stable.  Low suspicion for pneumonia, pneumothorax or bronchitis and therefore will defer imaging.  COVID testing negative, discussed findings.  Etiology viral, erythema present to the right tympanic membrane, recent external ear infection to same side, empirically placed on cefdinir  as well as prescribed guaifenesin  codeine  for management of cough, PDMP reviewed, limits. ay use additional over-the-counter medications as needed for supportive care.  May follow-up with urgent care as needed if symptoms persist or worsen.  Note given.   Final Clinical Impressions(s) / UC Diagnoses   Final diagnoses:  Acute URI     Discharge Instructions      COVID test negative  Right ear is starting to become red which is concerning for ear infection, take cefdinir  twice daily for 7 days  May use cough syrup at bedtime to allow for rest    You can take Tylenol  and/or Ibuprofen  as needed for fever reduction and pain relief.   For cough: honey 1/2 to 1 teaspoon (you can dilute the honey in water or another fluid).  You can also use guaifenesin  and dextromethorphan for cough. You can use a  humidifier for chest congestion and cough.  If you don't have a humidifier, you can sit in the bathroom with the hot shower running.      For sore throat: try warm salt water gargles, cepacol lozenges, throat spray, warm tea or water with lemon/honey, popsicles or ice, or OTC cold relief medicine for throat discomfort.   For congestion: take a daily anti-histamine like Zyrtec, Claritin, and a oral decongestant, such as pseudoephedrine.  You can also use Flonase 1-2 sprays in each nostril daily.   It is important to stay hydrated: drink plenty of fluids (water, gatorade/powerade/pedialyte, juices, or teas) to keep your throat moisturized and help further relieve irritation/discomfort.    ED Prescriptions     Medication Sig Dispense Auth. Provider   cefdinir  (OMNICEF ) 300 MG capsule Take 1 capsule (300 mg total) by mouth 2 (two) times daily for 7 days. 14 capsule Elias Bordner R, NP   guaiFENesin -codeine  100-10 MG/5ML syrup Take 5 mLs by mouth at bedtime as needed for cough. 120 mL Tashaun Obey, Shelba SAUNDERS, NP      I have reviewed the PDMP during this encounter.   Teresa Shelba SAUNDERS, [place] 11/08/23 [phone]

## 2023-11-08 NOTE — ED Provider Notes (Signed)
 [name]    CSN: [id number] Arrival date & time: 11/08/23  1306      History   Chief Complaint Chief Complaint  Patient presents with   Cough    Entered by patient   Nasal Congestion    HPI [name] [name] is a 28 y.o. female.   Patient presents for evaluation of fever peaking at one 1.2, nasal congestion,, sore throat and intermittent headaches present for 4 days.  Associated fatigue and malaise.  Sore throat has resolved.  Known exposure to sick contacts and COVID-19 at work.  Decreased appetite but tolerable to some food foods.  Denies shortness of breath or wheezing.  Past Medical History:  Diagnosis Date   Anemia    Breast mass    H/O rubella    H/O varicella    Hx of ovarian cyst    Preeclampsia, severe    with 1st pregnancy   Yeast infection     Patient Active Problem List   Diagnosis Date Noted   Encounter for sterilization 08/26/2017   Postpartum care following vaginal delivery 08/26/2017   Abdominal cramping affecting pregnancy 08/03/2017   Anemia of mother in pregnancy 06/17/2017   Migraine 06/13/2017   Nausea and vomiting during pregnancy 01/20/2017   Pregnancy, supervision, high-risk 01/10/2017   History of gestational hypertension 11/28/2011    Past Surgical History:  Procedure Laterality Date   AUGMENTATION MAMMAPLASTY     NO PAST SURGERIES     TUBAL LIGATION Bilateral 08/26/2017   Procedure: POST PARTUM TUBAL LIGATION;  Surgeon: Leonce Garnette BIRCH, MD;  Location: [facility] ORS;  Service: Gynecology;  Laterality: Bilateral;    OB History     Gravida  3   Para  3   Term  3   Preterm  0   AB  0   Living  3      SAB  0   IAB  0   Ectopic  0   Multiple  0   Live Births  3            Home Medications    Prior to Admission medications   Medication Sig Start Date End Date Taking? Authorizing Provider  cefdinir  (OMNICEF ) 300 MG capsule Take 1 capsule (300 mg total) by mouth 2 (two) times daily for 7 days. 11/08/23  11/15/23 Yes Zayveon Raschke R, NP  guaiFENesin -codeine  100-10 MG/5ML syrup Take 5 mLs by mouth at bedtime as needed for cough. 11/08/23  Yes Cataleia Gade R, NP  pseudoephedrine (SUDAFED) 120 MG 12 hr tablet Take 120 mg by mouth 2 (two) times daily.   Yes [provider]  azithromycin  (ZITHROMAX ) 250 MG tablet Take 1 tablet (250 mg total) by mouth daily. Take first 2 tablets together, then 1 every day until finished. 04/18/23   Corlis Burnard DEL, NP  ibuprofen  (ADVIL ,MOTRIN ) 600 MG tablet Take 1 tablet (600 mg total) by mouth every 6 (six) hours. Patient not taking: Reported on 03/31/2023 08/27/17   Lynda Bradley, CNM  lidocaine  (XYLOCAINE ) 2 % solution Use as directed 15 mLs in the mouth or throat as needed. 06/20/23   Teresa Shelba SAUNDERS, NP  ofloxacin  (FLOXIN ) 0.3 % OTIC solution Place 10 drops into the right ear daily. 10/25/23   Aishah Teffeteller, Shelba SAUNDERS, NP  predniSONE  (STERAPRED UNI-PAK 21 TAB) 10 MG (21) TBPK tablet Take by mouth daily. Take 6 tabs by mouth daily  for 1 days, then 5 tabs for 1 days, then 4 tabs for 1 days, then  3 tabs for 1 days, 2 tabs for 1 days, then 1 tab by mouth daily for 1 days 10/25/23   Teresa Shelba SAUNDERS, NP  Prenatal Vit-Fe Fumarate-FA (PRENATAL VITAMIN PO) Take by mouth. Patient not taking: Reported on 03/31/2023    [provider]  promethazine  (PHENERGAN ) 12.5 MG tablet Take 1 tablet (12.5 mg total) by mouth every 8 (eight) hours as needed for nausea or vomiting. Patient not taking: Reported on 03/31/2023 09/02/21   Antonetta Penne Ruth, PA    Family History Family History  Problem Relation Age of Onset   Alcohol abuse Mother    Depression Mother    Alcohol abuse Father    Diabetes Maternal Grandmother    Breast cancer Maternal Grandmother    Breast cancer Maternal Aunt    Mental retardation Cousin    Autism Cousin     Social History Social History   Tobacco Use   Smoking status: Never   Smokeless tobacco: Never  Vaping Use   Vaping status: Some Days   Substance Use Topics   Alcohol use: No   Drug use: No     Allergies   Penicillins   Review of Systems Review of Systems  Respiratory:  Positive for cough.      Physical Exam Triage Vital Signs ED Triage Vitals  Encounter Vitals Group     BP 11/08/23 1334 129/80     Girls Systolic BP Percentile --      Girls Diastolic BP Percentile --      Boys Systolic BP Percentile --      Boys Diastolic BP Percentile --      Pulse Rate 11/08/23 1334 74     Resp 11/08/23 1334 18     Temp 11/08/23 1334 98.4 F (36.9 C)     Temp Source 11/08/23 1334 Oral     SpO2 11/08/23 1332 97 %     Weight --      Height --      Head Circumference --      Peak Flow --      Pain Score 11/08/23 1332 0     Pain Loc --      Pain Education --      Exclude from Growth Chart --    No data found.  Updated Vital Signs BP 129/80 (BP Location: Left Arm)   Pulse 74   Temp 98.4 F (36.9 C) (Oral)   Resp 18   LMP 10/12/2023 (Exact Date)   SpO2 97%   Visual Acuity Right Eye Distance:   Left Eye Distance:   Bilateral Distance:    Right Eye Near:   Left Eye Near:    Bilateral Near:     Physical Exam Constitutional:      Appearance: Normal appearance.  HENT:     Right Ear: Ear canal and external ear normal.     Left Ear: Tympanic membrane, ear canal and external ear normal.     Ears:     Comments: Erythema affecting approximately 50% of the right tympanic membrane, no abnormality to the canal or external    Nose: Congestion present.     Mouth/Throat:     Pharynx: No oropharyngeal exudate or posterior oropharyngeal erythema.  Eyes:     Extraocular Movements: Extraocular movements intact.  Cardiovascular:     Rate and Rhythm: Normal rate and regular rhythm.     Pulses: Normal pulses.     Heart sounds: Normal heart sounds.  Pulmonary:  Effort: Pulmonary effort is normal.     Breath sounds: Normal breath sounds.  Musculoskeletal:     Cervical back: Normal range of motion and neck  supple.  Neurological:     Mental Status: She is alert and oriented to person, place, and time. Mental status is at baseline.      UC Treatments / Results  Labs (all labs ordered are listed, but only abnormal results are displayed) Labs Reviewed  POC SOFIA SARS ANTIGEN FIA    EKG   Radiology No results found.  Procedures Procedures (including critical care time)  Medications Ordered in UC Medications - No data to display  Initial Impression / Assessment and Plan / UC Course  I have reviewed the triage vital signs and the nursing notes.  Pertinent labs & imaging results that were available during my care of the patient were reviewed by me and considered in my medical decision making (see chart for details).  Acute URI  Patient is in no signs of distress nor toxic appearing.  Vital signs are stable.  Low suspicion for pneumonia, pneumothorax or bronchitis and therefore will defer imaging.  COVID testing negative, discussed findings.  Etiology viral, erythema present to the right tympanic membrane, recent external ear infection to same side, empirically placed on cefdinir  as well as prescribed guaifenesin  codeine  for management of cough, PDMP reviewed, limits. ay use additional over-the-counter medications as needed for supportive care.  May follow-up with urgent care as needed if symptoms persist or worsen.  Note given.   Final Clinical Impressions(s) / UC Diagnoses   Final diagnoses:  Acute URI     Discharge Instructions      COVID test negative  Right ear is starting to become red which is concerning for ear infection, take cefdinir  twice daily for 7 days  May use cough syrup at bedtime to allow for rest    You can take Tylenol  and/or Ibuprofen  as needed for fever reduction and pain relief.   For cough: honey 1/2 to 1 teaspoon (you can dilute the honey in water or another fluid).  You can also use guaifenesin  and dextromethorphan for cough. You can use a  humidifier for chest congestion and cough.  If you don't have a humidifier, you can sit in the bathroom with the hot shower running.      For sore throat: try warm salt water gargles, cepacol lozenges, throat spray, warm tea or water with lemon/honey, popsicles or ice, or OTC cold relief medicine for throat discomfort.   For congestion: take a daily anti-histamine like Zyrtec, Claritin, and a oral decongestant, such as pseudoephedrine.  You can also use Flonase 1-2 sprays in each nostril daily.   It is important to stay hydrated: drink plenty of fluids (water, gatorade/powerade/pedialyte, juices, or teas) to keep your throat moisturized and help further relieve irritation/discomfort.    ED Prescriptions     Medication Sig Dispense Auth. Provider   cefdinir  (OMNICEF ) 300 MG capsule Take 1 capsule (300 mg total) by mouth 2 (two) times daily for 7 days. 14 capsule Elias Bordner R, NP   guaiFENesin -codeine  100-10 MG/5ML syrup Take 5 mLs by mouth at bedtime as needed for cough. 120 mL Tashaun Obey, Shelba SAUNDERS, NP      I have reviewed the PDMP during this encounter.   Teresa Shelba SAUNDERS, [place] 11/08/23 [phone]

## 2023-11-08 NOTE — Discharge Instructions (Addendum)
 COVID test negative  Right ear is starting to become red which is concerning for ear infection, take cefdinir  twice daily for 7 days  May use cough syrup at bedtime to allow for rest    You can take Tylenol  and/or Ibuprofen  as needed for fever reduction and pain relief.   For cough: honey 1/2 to 1 teaspoon (you can dilute the honey in water or another fluid).  You can also use guaifenesin  and dextromethorphan for cough. You can use a humidifier for chest congestion and cough.  If you don't have a humidifier, you can sit in the bathroom with the hot shower running.      For sore throat: try warm salt water gargles, cepacol lozenges, throat spray, warm tea or water with lemon/honey, popsicles or ice, or OTC cold relief medicine for throat discomfort.   For congestion: take a daily anti-histamine like Zyrtec, Claritin, and a oral decongestant, such as pseudoephedrine.  You can also use Flonase 1-2 sprays in each nostril daily.   It is important to stay hydrated: drink plenty of fluids (water, gatorade/powerade/pedialyte, juices, or teas) to keep your throat moisturized and help further relieve irritation/discomfort.

## 2023-11-08 NOTE — ED Triage Notes (Signed)
 Patient complains of cough and runny nose x 4 days. Patient also states had a fever on Saturday of 101.2. Patient also reports several co-workers are positive for [employer]. Patient has been taking sudafed since Friday. Last dose was this morning at 9:00 am. Denies pain.

## 2023-11-09 ENCOUNTER — Encounter: Payer: Self-pay | Admitting: Oncology

## 2023-12-05 ENCOUNTER — Ambulatory Visit
Admission: RE | Admit: 2023-12-05 | Discharge: 2023-12-05 | Disposition: A | Discharge status: Home / Self Care | Payer: Self-pay | Source: Ambulatory Visit

## 2023-12-05 VITALS — BP 111/73 | HR 85 | Temp 98.2°F | Resp 18

## 2023-12-05 DIAGNOSIS — S8012XA Contusion of left lower leg, initial encounter: Secondary | ICD-10-CM
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
## 2023-12-05 NOTE — Discharge Instructions (Addendum)
  1. Contusion of left leg, initial encounter (Primary) - Continue with ibuprofen  or Tylenol  as needed for pain secondary to left upper leg contusion. - Try using compression stockings or warm compresses prior to activity to help warm the muscles prior to exercise. - Continue to monitor symptoms for any changes severity if there is any escalation recurrent symptoms or development of new symptoms follow-up for further evaluation and management.

## 2023-12-21 ENCOUNTER — Ambulatory Visit
Admission: RE | Admit: 2023-12-21 | Discharge: 2023-12-21 | Disposition: A | Discharge status: Home / Self Care | Payer: Self-pay | Attending: Emergency Medicine | Admitting: Emergency Medicine

## 2023-12-21 VITALS — BP 106/62 | HR 102 | Temp 97.8°F | Resp 18

## 2023-12-21 DIAGNOSIS — U071 COVID-19: Secondary | ICD-10-CM

## 2023-12-21 DIAGNOSIS — J029 Acute pharyngitis, unspecified: Secondary | ICD-10-CM

## 2023-12-21 DIAGNOSIS — R051 Acute cough: Secondary | ICD-10-CM

## 2023-12-21 LAB — POC COVID19/FLU A&B COMBO
Covid Antigen, POC: POSITIVE — AB
Influenza A Antigen, POC: NEGATIVE
Influenza B Antigen, POC: NEGATIVE

## 2023-12-21 LAB — POCT RAPID STREP A (OFFICE): Rapid Strep A Screen: NEGATIVE

## 2023-12-21 NOTE — ED Provider Notes (Signed)
 [name] [name]    CSN: [id number] Arrival date & time: 12/21/23  1244      History   Chief Complaint Chief Complaint  Patient presents with   Sore Throat    Sore throat coughing fever headache unable to sleep stopped up sinuses - Entered by patient   Cough   Nasal Congestion    HPI [name] [name] is a 28 y.o. female.  Patient presents with 2-day history of fever, runny nose, postnasal drip, sneezing, sore throat, cough.  She has been treating her symptoms with ibuprofen  and Sudafed.  No shortness of breath, vomiting, diarrhea.  The history is provided by the patient and medical records.    Past Medical History:  Diagnosis Date   Anemia    Breast mass    H/O rubella    H/O varicella    Hx of ovarian cyst    Preeclampsia, severe    with 1st pregnancy   Yeast infection     Patient Active Problem List   Diagnosis Date Noted   Encounter for sterilization 08/26/2017   Postpartum care following vaginal delivery 08/26/2017   Abdominal cramping affecting pregnancy 08/03/2017   Anemia of mother in pregnancy 06/17/2017   Migraine 06/13/2017   Nausea and vomiting during pregnancy 01/20/2017   Pregnancy, supervision, high-risk 01/10/2017   History of gestational hypertension 11/28/2011    Past Surgical History:  Procedure Laterality Date   AUGMENTATION MAMMAPLASTY     NO PAST SURGERIES     TUBAL LIGATION Bilateral 08/26/2017   Procedure: POST PARTUM TUBAL LIGATION;  Surgeon: Leonce Garnette BIRCH, MD;  Location: [facility];  Service: Gynecology;  Laterality: Bilateral;    OB History     Gravida  3   Para  3   Term  3   Preterm  0   AB  0   Living  3      SAB  0   IAB  0   Ectopic  0   Multiple  0   Live Births  3            Home Medications    Prior to Admission medications   Medication Sig Start Date End Date Taking? Authorizing Provider  azithromycin  (ZITHROMAX ) 250 MG tablet Take 1 tablet (250 mg total) by mouth daily. Take first 2  tablets together, then 1 every day until finished. 04/18/23   Corlis Burnard DEL, NP  guaiFENesin -codeine  100-10 MG/5ML syrup Take 5 mLs by mouth at bedtime as needed for cough. 11/08/23   White, Shelba SAUNDERS, NP  ibuprofen  (ADVIL ,MOTRIN ) 600 MG tablet Take 1 tablet (600 mg total) by mouth every 6 (six) hours. Patient not taking: Reported on 03/31/2023 08/27/17   Lynda Bradley, CNM  lidocaine  (XYLOCAINE ) 2 % solution Use as directed 15 mLs in the mouth or throat as needed. 06/20/23   White, Shelba SAUNDERS, NP  ofloxacin  (FLOXIN ) 0.3 % OTIC solution Place 10 drops into the right ear daily. 10/25/23   White, Shelba SAUNDERS, NP  predniSONE  (STERAPRED UNI-PAK 21 TAB) 10 MG (21) TBPK tablet Take by mouth daily. Take 6 tabs by mouth daily  for 1 days, then 5 tabs for 1 days, then 4 tabs for 1 days, then 3 tabs for 1 days, 2 tabs for 1 days, then 1 tab by mouth daily for 1 days 10/25/23   Teresa Shelba SAUNDERS, NP  Prenatal Vit-Fe Fumarate-FA (PRENATAL VITAMIN PO) Take by mouth. Patient not taking: Reported on 03/31/2023    [provider]  promethazine  (PHENERGAN ) 12.5 MG tablet Take 1 tablet (12.5 mg total) by mouth every 8 (eight) hours as needed for nausea or vomiting. Patient not taking: Reported on 03/31/2023 09/02/21   Antonetta Penne Ruth, PA  pseudoephedrine (SUDAFED) 120 MG 12 hr tablet Take 120 mg by mouth 2 (two) times daily.    [provider]    Family History Family History  Problem Relation Age of Onset   Alcohol abuse Mother    Depression Mother    Alcohol abuse Father    Diabetes Maternal Grandmother    Breast cancer Maternal Grandmother    Breast cancer Maternal Aunt    Mental retardation Cousin    Autism Cousin     Social History Social History   Tobacco Use   Smoking status: Never   Smokeless tobacco: Never  Vaping Use   Vaping status: Some Days  Substance Use Topics   Alcohol use: No   Drug use: No     Allergies   Penicillins   Review of Systems Review of Systems   Constitutional:  Positive for fever. Negative for chills.  HENT:  Positive for postnasal drip, rhinorrhea, sneezing and sore throat. Negative for ear pain.   Respiratory:  Positive for cough. Negative for shortness of breath.   Gastrointestinal:  Negative for diarrhea and vomiting.     Physical Exam Triage Vital Signs ED Triage Vitals  Encounter Vitals Group     BP 12/21/23 1307 106/62     Girls Systolic BP Percentile --      Girls Diastolic BP Percentile --      Boys Systolic BP Percentile --      Boys Diastolic BP Percentile --      Pulse Rate 12/21/23 1307 (!) 102     Resp 12/21/23 1307 18     Temp 12/21/23 1307 97.8 F (36.6 C)     Temp src --      SpO2 12/21/23 1307 99 %     Weight --      Height --      Head Circumference --      Peak Flow --      Pain Score 12/21/23 1310 5     Pain Loc --      Pain Education --      Exclude from Growth Chart --    No data found.  Updated Vital Signs BP 106/62   Pulse (!) 102   Temp 97.8 F (36.6 C)   Resp 18   LMP 12/03/2023 (Exact Date)   SpO2 99%   Visual Acuity Right Eye Distance:   Left Eye Distance:   Bilateral Distance:    Right Eye Near:   Left Eye Near:    Bilateral Near:     Physical Exam Constitutional:      General: She is not in acute distress. HENT:     Right Ear: Tympanic membrane normal.     Left Ear: Tympanic membrane normal.     Nose: Rhinorrhea present.     Mouth/Throat:     Mouth: Mucous membranes are moist.     Pharynx: Oropharynx is clear.  Cardiovascular:     Rate and Rhythm: Normal rate and regular rhythm.     Heart sounds: Normal heart sounds.  Pulmonary:     Effort: Pulmonary effort is normal. No respiratory distress.     Breath sounds: Normal breath sounds.  Neurological:     Mental Status: She is alert.  UC Treatments / Results  Labs (all labs ordered are listed, but only abnormal results are displayed) Labs Reviewed  POC COVID19/FLU A&B COMBO - Abnormal; Notable for  the following components:      Result Value   Covid Antigen, POC Positive (*)    All other components within normal limits  POCT RAPID STREP A (OFFICE)    EKG   Radiology No results found.  Procedures Procedures (including critical care time)  Medications Ordered in UC Medications - No data to display  Initial Impression / Assessment and Plan / UC Course  I have reviewed the triage vital signs and the nursing notes.  Pertinent labs & imaging results that were available during my care of the patient were reviewed by me and considered in my medical decision making (see chart for details).    COVID, sore throat, cough.  Afebrile and vital signs are stable.  Lungs are clear and O2 sat is 99% on room air.  Rapid COVID test is positive.  Flu is negative.  Strep is negative.  Discussed symptomatic treatment including Tylenol  or ibuprofen  as needed, plain Mucinex  as needed, rest, hydration.  Education provided on COVID.  ED precautions given.  Instructed patient to follow up with her PCP if her symptoms are not improving.  She agrees to plan of care.   Final Clinical Impressions(s) / UC Diagnoses   Final diagnoses:  Sore throat  Acute cough  COVID-19     Discharge Instructions      Your COVID test is positive.  The strep and flu tests are negative.   Take Tylenol  or ibuprofen  as needed for fever or discomfort.  Take plain Mucinex  as needed for congestion.  Rest and keep yourself hydrated.    Follow-up with your primary care provider if your symptoms are not improving.         ED Prescriptions   None    PDMP not reviewed this encounter.   Corlis Burnard DEL, NP 12/21/23 [phone]

## 2023-12-21 NOTE — ED Triage Notes (Signed)
 Patient reports runny nose, sore throat, dry cough, sneezing and fever x 2 days. Patient has taken sudafed and motrin  with no relief. Rates pain 5/10.

## 2023-12-21 NOTE — Discharge Instructions (Addendum)
 Your COVID test is positive.  The strep and flu tests are negative.   Take Tylenol  or ibuprofen  as needed for fever or discomfort.  Take plain Mucinex  as needed for congestion.  Rest and keep yourself hydrated.    Follow-up with your primary care provider if your symptoms are not improving.
